# Patient Record
Sex: Female | Born: 1975 | Hispanic: No | Marital: Married | Smoking: Never smoker
Health system: Southern US, Community
[De-identification: ages and names within clinical notes are randomized; demographics above are authoritative.]

## PROBLEM LIST (undated history)

## (undated) DIAGNOSIS — C4490 Unspecified malignant neoplasm of skin, unspecified: Secondary | ICD-10-CM

## (undated) DIAGNOSIS — Z803 Family history of malignant neoplasm of breast: Secondary | ICD-10-CM

## (undated) DIAGNOSIS — I10 Essential (primary) hypertension: Secondary | ICD-10-CM

## (undated) DIAGNOSIS — B009 Herpesviral infection, unspecified: Secondary | ICD-10-CM

## (undated) DIAGNOSIS — E785 Hyperlipidemia, unspecified: Secondary | ICD-10-CM

## (undated) DIAGNOSIS — Z800 Family history of malignant neoplasm of digestive organs: Secondary | ICD-10-CM

## (undated) DIAGNOSIS — Z8049 Family history of malignant neoplasm of other genital organs: Secondary | ICD-10-CM

## (undated) DIAGNOSIS — H409 Unspecified glaucoma: Secondary | ICD-10-CM

## (undated) HISTORY — DX: Unspecified malignant neoplasm of skin, unspecified: C44.90

## (undated) HISTORY — DX: Herpesviral infection, unspecified: B00.9

## (undated) HISTORY — PX: CHOLECYSTECTOMY: SHX55

## (undated) HISTORY — DX: Family history of malignant neoplasm of breast: Z80.3

## (undated) HISTORY — DX: Family history of malignant neoplasm of digestive organs: Z80.0

## (undated) HISTORY — DX: Hyperlipidemia, unspecified: E78.5

## (undated) HISTORY — DX: Family history of malignant neoplasm of other genital organs: Z80.49

## (undated) HISTORY — DX: Unspecified glaucoma: H40.9

## (undated) HISTORY — PX: HEMORRHOID SURGERY: SHX153

## (undated) HISTORY — DX: Essential (primary) hypertension: I10

---

## 2015-10-30 HISTORY — PX: DG CHOLECYSTOGRAPHY GALL BLADDER (ARMC HX): HXRAD1480

## 2018-05-16 ENCOUNTER — Encounter: Payer: Self-pay | Admitting: Family Medicine

## 2018-05-16 ENCOUNTER — Encounter (INDEPENDENT_AMBULATORY_CARE_PROVIDER_SITE_OTHER): Payer: Self-pay

## 2018-05-16 ENCOUNTER — Ambulatory Visit: Payer: Self-pay | Admitting: Family Medicine

## 2018-05-16 VITALS — BP 102/62 | HR 79 | Temp 98.2°F | Ht 63.5 in | Wt 140.2 lb

## 2018-05-16 DIAGNOSIS — I10 Essential (primary) hypertension: Secondary | ICD-10-CM | POA: Diagnosis not present

## 2018-05-16 DIAGNOSIS — E785 Hyperlipidemia, unspecified: Secondary | ICD-10-CM | POA: Diagnosis not present

## 2018-05-16 DIAGNOSIS — N939 Abnormal uterine and vaginal bleeding, unspecified: Secondary | ICD-10-CM

## 2018-05-16 MED ORDER — HYDROCHLOROTHIAZIDE 25 MG PO TABS: 25.0000 mg | ORAL_TABLET | Freq: Every day | ORAL | 3 refills | Status: DC

## 2018-05-16 NOTE — Progress Notes (Signed)
Chief Complaint  Patient presents with  . New Patient (Initial Visit)       New Patient Visit SUBJECTIVE: HPI: Ashley Shepard is an 42 y.o.female who is being seen for establishing care.  The patient was previously seen at office in [place].  She is here with her husband who helps translate.  The patient mainly speaks Portuguese/Spanish.  Hypertension Patient presents for hypertension follow up. She does monitor home blood pressures. She is compliant with medications. Patient has these side effects of medication: none She is adhering to a healthy diet overall. Exercise: 3-4x/week running; rowing  Having early cycle. A little heavier.  She has been under quite a bit of stress.  She is new to the country and her English is not very good.  Her cycles have been normal prior to this.  She is eating and drinking normally otherwise.   Allergies  Allergen Reactions  . Dipyrone Itching and Swelling    Itch and swelling Blood pressure gets low Throat swelling  . Iodine     Itch and swelling     Past Medical History:  Diagnosis Date  . Hyperlipidemia   . Skin cancer    Past Surgical History:  Procedure Laterality Date  . CESAREAN SECTION  1997  . DG CHOLECYSTOGRAPHY GALL BLADDER ([place] HX)  2017   Family History  Problem Relation Age of Onset  . Diabetes Mother   . Hypertension Mother   . Heart attack Father   . Hypertension Father    Allergies  Allergen Reactions  . Dipyrone Itching and Swelling    Itch and swelling Blood pressure gets low Throat swelling  . Iodine     Itch and swelling     Current Outpatient Medications:  .  hydrochlorothiazide (HYDRODIURIL) 25 MG tablet, Take 1 tablet (25 mg total) by mouth daily., Disp: 30 tablet, Rfl: 3  ROS Cardiovascular: Denies chest pain  Respiratory: Denies dyspnea   OBJECTIVE: BP 102/62 (BP Location: Left Arm, Patient Position: Sitting, Cuff Size: Normal)   Pulse 79   Temp 98.2 F (36.8 C) (Oral)   Ht 5' 3.5"  (1.613 m)   Wt 140 lb 4 oz (63.6 kg)   SpO2 97%   BMI 24.45 kg/m   Constitutional: -  VS reviewed -  Well developed, well nourished, appears stated age -  No apparent distress  Psychiatric: -  Oriented to person, place, and time -  Memory intact -  Affect and mood normal -  Fluent conversation, good eye contact -  Judgment and insight age appropriate  Eye: -  Conjunctivae clear, no discharge -  Pupils symmetric, round, reactive to light  ENMT: -  MMM    Pharynx moist, no exudate, no erythema  Neck: -  No gross swelling, no palpable masses -  Thyroid midline, not enlarged, mobile, no palpable masses  Cardiovascular: -  RRR -  No bruits -  No LE edema  Respiratory: -  Normal respiratory effort, no accessory muscle use, no retraction -  Breath sounds equal, no wheezes, no ronchi, no crackles  Musculoskeletal: -  No clubbing, no cyanosis -  Gait normal  Skin: -  No significant lesion on inspection -  Warm and dry to palpation   ASSESSMENT/PLAN: Essential hypertension - Plan: hydrochlorothiazide (HYDRODIURIL) 25 MG tablet  Abnormal uterine bleeding (AUB)  Hyperlipidemia, unspecified hyperlipidemia type  Change Micardis to hydrochlorothiazide alone.  Counseled on diet and exercise.  She needs to watch her salt intake. Recommend getting a  home blood pressure monitor to check readings at home. For abnormal uterine bleeding, I recommended watchful waiting.  She has a known stressor that could be throwing off her cycles.  I also suggested Aleve if her flow is heavy.  Could do oral progesterone challenge if she continues to have issues.  She requested some information for the gynecology team which I provided in her after visit summary.   Patient should return in 6 weeks. The patient and her husband voiced understanding and agreement to the plan.   [place], DO 05/16/18  12:04 PM

## 2018-05-16 NOTE — Patient Instructions (Addendum)
Keep the diet clean and stay active.  Stop the Micardis.   Get a home blood pressure monitor and check at home. Write down readings and we will use them to make decisions moving forward.  Mind the salt, stay active, elevate legs.   Call Center for [place] at [facility] at [phone] for an appointment.  They are located at [address], [place], [place], [address] (right across the hall from our office).  Consider taking Aleve (naproxen) 220 mg, 2 tabs twice daily for heavy flow.   Let us know if you need anything.

## 2018-05-16 NOTE — Progress Notes (Signed)
Pre visit review using our clinic review tool, if applicable. No additional management support is needed unless otherwise documented below in the visit note. 

## 2018-05-26 ENCOUNTER — Ambulatory Visit: Payer: Self-pay | Admitting: *Deleted

## 2018-05-26 NOTE — Telephone Encounter (Signed)
Patient and husband phoned. Patient has high blood pressure today. 9am 147/101, p.82, 10am 147/111, p. 68, 11:45a 152/113 p. 78, 2:30 137/107 p. 77. She take hydrodiuril 25 MG daily as prescribed. She has left arm and back of neck discomfort, no numbess or weakness reported. She reports having  a headache over the weekend but not today. She has been seeing "bubbles" with the headaches with some nausea.  Denies CP/dizziness/SOB.Advised emergency care for high blood pressure with neurologic symptoms. Husband will drive her to UC or [facility] at this time.  Reason for Disposition . [0] Systolic BP  >= 258 OR Diastolic >= 527 AND [7] cardiac or neurologic symptoms (e.g., chest pain, difficulty breathing, unsteady gait, blurred vision)  Answer Assessment - Initial Assessment Questions 1. BLOOD PRESSURE: "What is the blood pressure?" "Did you take at least two measurements 5 minutes apart?"    9am 147/101 p 82 10a 147/111 p.68 1200 152/113 p.78 136/94 p.76  2. ONSET: "When did you take your blood pressure?"      3. HOW: "How did you obtain the blood pressure?" (e.g., visiting nurse, automatic home BP monitor)     Automatic home monitor 4. HISTORY: "Do you have a history of high blood pressure?"     yes 5. MEDICATIONS: "Are you taking any medications for blood pressure?" "Have you missed any doses recently?"     no 6. OTHER SYMPTOMS: "Do you have any symptoms?" (e.g., headache, chest pain, blurred vision, difficulty breathing, weakness)     Left arm discomfort, back of neck discomfort, stomach ache, feeling need to have BM. Blurred vision. No CP 7. PREGNANCY: "Is there any chance you are pregnant?" "When was your last menstrual period?"     no  Protocols used: HIGH BLOOD PRESSURE-A-AH

## 2018-07-23 ENCOUNTER — Telehealth: Payer: Self-pay | Admitting: Obstetrics and Gynecology

## 2018-07-23 ENCOUNTER — Encounter: Payer: Self-pay | Admitting: Obstetrics and Gynecology

## 2018-07-23 ENCOUNTER — Ambulatory Visit: Payer: BLUE CROSS/BLUE SHIELD | Admitting: Obstetrics and Gynecology

## 2018-07-23 ENCOUNTER — Other Ambulatory Visit: Payer: Self-pay

## 2018-07-23 VITALS — BP 110/62 | HR 64 | Resp 14 | Ht 64.0 in | Wt 140.0 lb

## 2018-07-23 DIAGNOSIS — N943 Premenstrual tension syndrome: Secondary | ICD-10-CM | POA: Diagnosis not present

## 2018-07-23 DIAGNOSIS — Z809 Family history of malignant neoplasm, unspecified: Secondary | ICD-10-CM

## 2018-07-23 DIAGNOSIS — R102 Pelvic and perineal pain: Secondary | ICD-10-CM | POA: Diagnosis not present

## 2018-07-23 DIAGNOSIS — N760 Acute vaginitis: Secondary | ICD-10-CM | POA: Diagnosis not present

## 2018-07-23 MED ORDER — SERTRALINE HCL 50 MG PO TABS
50.0000 mg | ORAL_TABLET | Freq: Every day | ORAL | 5 refills | Status: DC
Start: 2018-07-23 — End: 2018-09-23

## 2018-07-23 NOTE — Patient Instructions (Signed)
Consider Dr. Rolan Lipa for your primary care provider.

## 2018-07-23 NOTE — Progress Notes (Signed)
42 y.o. No obstetric history on file. Married [place]  female here for annual exam.  She states that the past couple of months her cycle has been late by 4-5 days She states that she found in [place] that she has endometriosis but did not have the time to treat that because she moved to [place].   Also having vaginal discharge and itching.   Endometriosis diagnosed in January, 2019 in [place].  Strong cramping and heavy menses that has keep her at home.  Having pain outside of menstrual cycle, swelling in her abdomen. Did an MRI 11/08/2017 and showed endometriosis.  Endometrium with 1 cm mass.  Myometrium homogeneous.  Ovaries - left ovary with small hemorrhagic area.  Areas of masses near the right uterosacral ligament and near anus.   Menses can occur up to every 45 days.  Has PMS syndrome - anxiety, irritable, and without patience which resolves when she has her menses start.  Not able to take pills due to HTN.   Second marriage and would like to consider childbearing.   Here in [place] for 3 months so far.  Husband working for [employer].   PCP:   None.   Patient's last menstrual period was 07/04/2018.     Period Cycle (Days): 28 Period Duration (Days): 7 Period Pattern: Regular Menstrual Flow: Heavy Menstrual Control: Maxi pad, Tampon Dysmenorrhea: (!) Severe Dysmenorrhea Symptoms: Cramping     Sexually active: Yes.    The current method of family planning is none.    Exercising: Yes.    fitness ballet  Smoker:  no  Health Maintenance: Pap:  04/2018 per patient  History of abnormal Pap:  no MMG:  11/11/2017 - BI-RADS2, heterogeneous density.  [place]. Colonoscopy:  none BMD:   11/19/17 - normal in [place].  TDaP:  07/2017 Gardasil:   no HIV: --- Hep C: --- Screening Labs:  None.   reports that she has never smoked. She has never used smokeless tobacco. She reports that she does not drink alcohol or use drugs.  Past Medical History:  Diagnosis Date  . Essential  hypertension   . Hyperlipidemia   . Skin cancer     Past Surgical History:  Procedure Laterality Date  . CESAREAN SECTION  1997  . CHOLECYSTECTOMY    . DG CHOLECYSTOGRAPHY GALL BLADDER ([place] HX)  2017  . HEMORRHOID SURGERY      Current Outpatient Medications  Medication Sig Dispense Refill  . hydrochlorothiazide (HYDRODIURIL) 25 MG tablet Take 1 tablet (25 mg total) by mouth daily. 30 tablet 3  . telmisartan (MICARDIS) 20 MG tablet Take 40 mg by mouth daily.  1   No current facility-administered medications for this visit.     Family History  Problem Relation Age of Onset  . Diabetes Mother   . Hypertension Mother   . High Cholesterol Mother   . Heart attack Father   . Hypertension Father   . High Cholesterol Father   . Kidney disease Father   . Stroke Father   . Diabetes Father   . Heart disease Father   . Diabetes Daughter   . Depression Daughter   . High blood pressure Sister   . Cancer Maternal Grandmother        uterus, ovary, pancreas  . Heart attack Maternal Grandfather   . Heart attack Paternal Grandfather   . High blood pressure Sister   . Cancer Maternal Aunt 68       breast   . Cancer Maternal Uncle  59       colon    Review of Systems  Genitourinary: Positive for menstrual problem.       Endometriosis     Exam:   BP 110/62   Pulse 64   Resp 14   Ht 5\' 4"  (1.626 m)   Wt 140 lb (63.5 kg)   LMP 07/04/2018   BMI 24.03 kg/m     General appearance: alert, cooperative and appears stated age Head: Normocephalic, without obvious abnormality, atraumatic Neck: no adenopathy, supple, symmetrical, trachea midline and thyroid normal to inspection and palpation Lungs: clear to auscultation bilaterally Breasts: normal appearance, no masses or tenderness, No nipple retraction or dimpling, No nipple discharge or bleeding, No axillary or supraclavicular adenopathy Heart: regular rate and rhythm Abdomen: soft, non-tender; no masses, no  organomegaly Extremities: extremities normal, atraumatic, no cyanosis or edema Skin: Skin color, texture, turgor normal. No rashes or lesions Lymph nodes: Cervical, supraclavicular, and axillary nodes normal. No abnormal inguinal nodes palpated Neurologic: Grossly normal  Pelvic: External genitalia:  no lesions              Urethra:  normal appearing urethra with no masses, tenderness or lesions              Bartholins and Skenes: normal                 Vagina: normal appearing vagina with normal color and discharge, no lesions              Cervix: no lesions              Pap taken: No. Bimanual Exam:  Uterus:  normal size, contour, position, consistency, mobility, non-tender              Adnexa: no mass, fullness, tenderness              Rectal exam: Yes.  .  Confirms.              Anus:  normal sphincter tone, no lesions  Chaperone was present for exam.  Assessment:     Pelvic pain.  Possible endometriosis.  Vaginitis. Interest in future fertility.  PMS. HTN. FH cancers.   Plan:   We will need a copy of the actual MRI images for re-review here.  Return visit for pelvic ultrasound here.  Referral for genetic counseling and testing.  Fertility planned performed with discussion of AMA status.  I think she really needs to see a reproductive endocrinologist.  She will discuss this with her husband. Discussed PMS.  Start Soloft 50 mg daily for 14 days prior to menses.  Discussed side effects and serotonin syndrome.  Affirm.  Follow up annually and prn.    __60_____ minutes face to face time of which over 50% was spent in counseling.   After visit summary provided.

## 2018-07-23 NOTE — Telephone Encounter (Signed)
Called placed by the interpreter service to convey benefits. Left message requesting a return call. Rep: Su Grand. ID: [id number]

## 2018-07-24 LAB — VAGINITIS/VAGINOSIS, DNA PROBE
Candida Species: NEGATIVE
GARDNERELLA VAGINALIS: NEGATIVE
Trichomonas vaginosis: NEGATIVE

## 2018-08-04 NOTE — Telephone Encounter (Signed)
Spoke with patient via interpreter, Ashley Shepard, [phone] regarding benefit for ultraosund. Patient understood and agreeable. Patient ready to schedule. Patient scheduled 08/07/18 with Dr Quincy Simmonds. Patient aware of appointment date, arrival time and e of cancellation policy.    Patient asked for lab results from last appointment. Call routed to Glorianne Manchester, RN

## 2018-08-04 NOTE — Telephone Encounter (Signed)
Spoke with patient via interpreter, Rosana, # [id number]. Advised patient of negative vaginitis testing dated 07/23/18 per Dr. Quincy Simmonds. Patient verbalizes understanding and is agreeable. Encounter closed.

## 2018-08-04 NOTE — Telephone Encounter (Signed)
Patient returned call. Returned her call with interpreter: Ashley Shepard, # [id number].

## 2018-08-04 NOTE — Telephone Encounter (Signed)
Call placed to patient (without an interpreter). Patient advises [place] and will need to call back.

## 2018-08-07 ENCOUNTER — Telehealth: Payer: Self-pay | Admitting: Genetics

## 2018-08-07 ENCOUNTER — Ambulatory Visit: Payer: BLUE CROSS/BLUE SHIELD | Admitting: Obstetrics and Gynecology

## 2018-08-07 ENCOUNTER — Other Ambulatory Visit: Payer: Self-pay | Admitting: Family Medicine

## 2018-08-07 ENCOUNTER — Ambulatory Visit (INDEPENDENT_AMBULATORY_CARE_PROVIDER_SITE_OTHER): Payer: BLUE CROSS/BLUE SHIELD

## 2018-08-07 ENCOUNTER — Encounter: Payer: Self-pay | Admitting: Genetics

## 2018-08-07 VITALS — BP 116/64 | HR 66 | Resp 14 | Ht 64.0 in | Wt 139.6 lb

## 2018-08-07 DIAGNOSIS — Z3141 Encounter for fertility testing: Secondary | ICD-10-CM | POA: Diagnosis not present

## 2018-08-07 DIAGNOSIS — R102 Pelvic and perineal pain: Secondary | ICD-10-CM

## 2018-08-07 DIAGNOSIS — N926 Irregular menstruation, unspecified: Secondary | ICD-10-CM

## 2018-08-07 DIAGNOSIS — I10 Essential (primary) hypertension: Secondary | ICD-10-CM

## 2018-08-07 NOTE — Telephone Encounter (Signed)
New referral received from Dr. Quincy Simmonds for genetic counseling. Pt cld and has been scheduled to see Ferol Luz on 10/22 at 10am. Pt aware to arrive 30 minutes early. Letter mailed.

## 2018-08-07 NOTE — Progress Notes (Signed)
GYNECOLOGY  VISIT   HPI: 42 y.o.   Married  [place]   female   No obstetric history on file. with Patient's last menstrual period was 07/04/2018.   here for   Ultrasound.  [place] interpretor present today.   Endometriosis diagnosed in [place] by MRI prior to moving to the [place].  She has not treated this to date.   Considering childbearing with her second husband.   Has PMDD and I recently prescribed Zoloft during premenstrual period.  GYNECOLOGIC HISTORY: Patient's last menstrual period was 07/04/2018. Contraception:  None  Menopausal hormone therapy:  none Last mammogram:  11/11/17  Bi rads 2 Heterogeneous density [place]  Last pap smear:   7/19 per patient.         OB History   None        Patient Active Problem List   Diagnosis Date Noted  . Essential hypertension 05/16/2018  . Abnormal uterine bleeding (AUB) 05/16/2018    Past Medical History:  Diagnosis Date  . Essential hypertension   . Hyperlipidemia   . Skin cancer     Past Surgical History:  Procedure Laterality Date  . CESAREAN SECTION  1997  . CHOLECYSTECTOMY    . DG CHOLECYSTOGRAPHY GALL BLADDER ([facility] HX)  2017  . HEMORRHOID SURGERY      Current Outpatient Medications  Medication Sig Dispense Refill  . hydrochlorothiazide (HYDRODIURIL) 25 MG tablet TAKE 1 TABLET BY MOUTH EVERY DAY 30 tablet 0  . sertraline (ZOLOFT) 50 MG tablet Take 1 tablet (50 mg total) by mouth daily. Take for 14 days in a row prior to menses. 28 tablet 5  . telmisartan (MICARDIS) 20 MG tablet Take 40 mg by mouth daily.  1   No current facility-administered medications for this visit.      ALLERGIES: Dipyrone and Iodine  Family History  Problem Relation Age of Onset  . Diabetes Mother   . Hypertension Mother   . High Cholesterol Mother   . Heart attack Father   . Hypertension Father   . High Cholesterol Father   . Kidney disease Father   . Stroke Father   . Diabetes Father   . Heart disease Father   . Diabetes  Daughter   . Depression Daughter   . High blood pressure Sister   . Cancer Maternal Grandmother        uterus, ovary, pancreas  . Heart attack Maternal Grandfather   . Heart attack Paternal Grandfather   . High blood pressure Sister   . Cancer Maternal Aunt 24       breast   . Cancer Maternal Uncle 13       colon    Social History   Socioeconomic History  . Marital status: Married    Spouse name: Not on file  . Number of children: Not on file  . Years of education: Not on file  . Highest education level: Not on file  Occupational History  . Not on file  Social Needs  . Financial resource strain: Not on file  . Food insecurity:    Worry: Not on file    Inability: Not on file  . Transportation needs:    Medical: Not on file    Non-medical: Not on file  Tobacco Use  . Smoking status: Never Smoker  . Smokeless tobacco: Never Used  Substance and Sexual Activity  . Alcohol use: Never    Frequency: Never  . Drug use: Never  . Sexual activity: Not on  file  Lifestyle  . Physical activity:    Days per week: Not on file    Minutes per session: Not on file  . Stress: Not on file  Relationships  . Social connections:    Talks on phone: Not on file    Gets together: Not on file    Attends religious service: Not on file    Active member of club or organization: Not on file    Attends meetings of clubs or organizations: Not on file    Relationship status: Not on file  . Intimate partner violence:    Fear of current or ex partner: Not on file    Emotionally abused: Not on file    Physically abused: Not on file    Forced sexual activity: Not on file  Other Topics Concern  . Not on file  Social History Narrative  . Not on file    Review of Systems  All other systems reviewed and are negative.   PHYSICAL EXAMINATION:    BP 116/64 (BP Location: Right Arm, Patient Position: Sitting, Cuff Size: Normal)   Pulse 66   Resp 14   Ht 5\' 4"  (1.626 m)   Wt 139 lb 9.6 oz  (63.3 kg)   LMP 07/04/2018   BMI 23.96 kg/m     General appearance: alert, cooperative and appears stated age   Pelvic US  Uterus no masses.  EMS 7.38 mm. Right ovary with follicle.  Left ovary with 28 mm hemorrhagic versus other cyst.  No free fluid.   ASSESSMENT  Pelvic pain.  Irregular menses. Desire for future fertility. AMA status.  PMDD. FH cancers.  PLAN  AMH level. Discussed fertility and endometriosis.  Declines tx for pelvic pain.  Refer to RE&I.  She will bring a copy of her MRI to her visit with RE&I. We discussed her AMA status.  Continue Zoloft 50 mg for 2 weeks per month. Refer to [employer].     An After Visit Summary was printed and given to the patient.  ___25___ minutes face to face time of which over 50% was spent in counseling.

## 2018-08-08 NOTE — Telephone Encounter (Signed)
Not sure she is your patient.

## 2018-08-08 NOTE — Telephone Encounter (Signed)
I did see her in July and wanted her to f/u with me. Schedule her, 30 d sent in meanwhile. ty.

## 2018-08-08 NOTE — Telephone Encounter (Signed)
Called the patient informed of PCP instructions She will call back to schedule appt,,,she did not know what her availability would be

## 2018-08-08 NOTE — Telephone Encounter (Signed)
Called the patient left message to call back 

## 2018-08-10 ENCOUNTER — Encounter: Payer: Self-pay | Admitting: Obstetrics and Gynecology

## 2018-08-11 LAB — ANTI MULLERIAN HORMONE: ANTI-MULLERIAN HORMONE (AMH): 0.157 ng/mL

## 2018-08-12 ENCOUNTER — Other Ambulatory Visit: Payer: Self-pay | Admitting: *Deleted

## 2018-08-12 DIAGNOSIS — R7989 Other specified abnormal findings of blood chemistry: Secondary | ICD-10-CM

## 2018-08-12 DIAGNOSIS — E349 Endocrine disorder, unspecified: Secondary | ICD-10-CM

## 2018-08-12 DIAGNOSIS — Z3141 Encounter for fertility testing: Secondary | ICD-10-CM

## 2018-08-19 ENCOUNTER — Inpatient Hospital Stay: Payer: BLUE CROSS/BLUE SHIELD

## 2018-08-19 ENCOUNTER — Inpatient Hospital Stay: Payer: BLUE CROSS/BLUE SHIELD | Attending: Genetic Counselor | Admitting: Genetics

## 2018-08-19 ENCOUNTER — Encounter: Payer: Self-pay | Admitting: Genetics

## 2018-08-19 DIAGNOSIS — Z8049 Family history of malignant neoplasm of other genital organs: Secondary | ICD-10-CM | POA: Diagnosis not present

## 2018-08-19 DIAGNOSIS — Z803 Family history of malignant neoplasm of breast: Secondary | ICD-10-CM

## 2018-08-19 DIAGNOSIS — Z800 Family history of malignant neoplasm of digestive organs: Secondary | ICD-10-CM | POA: Diagnosis not present

## 2018-08-19 NOTE — Progress Notes (Signed)
REFERRING PROVIDER: Nunzio Cobbs, MD [address], [place] [address]  PRIMARY PROVIDER:  Patient, No Pcp Per  PRIMARY REASON FOR VISIT:  1. Family history of breast cancer   2. Family history of colon cancer   3. Family history of uterine cancer   4. Family history of pancreatic cancer     HISTORY OF PRESENT ILLNESS:   Ashley Shepard, a 42 y.o. female, was seen for a [place] cancer genetics consultation at the request of Dr. Yisroel Ramming due to a family history of cancer.  Ashley Shepard presents to clinic today to discuss the possibility of a hereditary predisposition to cancer, genetic testing, and to further clarify her future cancer risks, as well as potential cancer risks for family members.   Ashley Shepard is a 42 y.o. female with no personal history of cancer.    She reports having a few skin lesions removed that were abnormal, but not cancer.    HORMONAL RISK FACTORS:  Menarche was at age 11.  First live birth at age 21.  OCP use for approximately 0 years.  Ovaries intact: yes.  Hysterectomy: no.  Menopausal status: premenopausal.  HRT use: 0 years. Colonoscopy: no; not examined. Mammogram within the last year: yes. Number of breast biopsies: 0.  Past Medical History:  Diagnosis Date  . Essential hypertension   . Family history of breast cancer   . Family history of colon cancer   . Family history of pancreatic cancer   . Family history of uterine cancer   . Hyperlipidemia   . Skin cancer     Past Surgical History:  Procedure Laterality Date  . CESAREAN SECTION  1997  . CHOLECYSTECTOMY    . DG CHOLECYSTOGRAPHY GALL BLADDER ([facility] HX)  2017  . HEMORRHOID SURGERY      Social History   Socioeconomic History  . Marital status: Married    Spouse name: Not on file  . Number of children: Not on file  . Years of education: Not on file  . Highest education level: Not on file  Occupational History  . Not on file  Social  Needs  . Financial resource strain: Not on file  . Food insecurity:    Worry: Not on file    Inability: Not on file  . Transportation needs:    Medical: Not on file    Non-medical: Not on file  Tobacco Use  . Smoking status: Never Smoker  . Smokeless tobacco: Never Used  Substance and Sexual Activity  . Alcohol use: Never    Frequency: Never  . Drug use: Never  . Sexual activity: Not on file  Lifestyle  . Physical activity:    Days per week: Not on file    Minutes per session: Not on file  . Stress: Not on file  Relationships  . Social connections:    Talks on phone: Not on file    Gets together: Not on file    Attends religious service: Not on file    Active member of club or organization: Not on file    Attends meetings of clubs or organizations: Not on file    Relationship status: Not on file  Other Topics Concern  . Not on file  Social History Narrative  . Not on file     FAMILY HISTORY:  We obtained a detailed, 4-generation family history.  Significant diagnoses are listed below: Family History  Problem Relation Age of Onset  .  Diabetes Mother   . Hypertension Mother   . High Cholesterol Mother   . Heart attack Father   . Hypertension Father   . High Cholesterol Father   . Kidney disease Father   . Stroke Father   . Diabetes Father   . Heart disease Father   . Diabetes Daughter   . Depression Daughter   . High blood pressure Sister   . Ovarian cancer Maternal Grandmother 20       ut and ov cancer dx at the same time in erh 60's,  . Uterine cancer Maternal Grandmother 60  . Pancreatic cancer Maternal Grandmother 80       died 1 month after  . Heart attack Maternal Grandfather   . Heart attack Paternal Grandfather   . High blood pressure Sister   . Breast cancer Maternal Aunt 68  . Colon cancer Maternal Uncle 23  . Pancreatic cancer Paternal Grandmother     Ashley Shepard has a 86 year-old daughter with no history of cancer.  Ashley Shepard has 3 sisters  ages 64. 25, and 80 with no history of cancer.  Her youngest sister had a hysterectomy due to ovarian cysts.   Ashley Shepard father: 21, no history of cancer.  Paternal Aunts/Uncles: 1 paternal uncle is 49 with no history of cancer.  Paternal cousins: no history of cancer.  Paternal grandfather: died of cancer, unk what type Paternal grandmother:pancreatic cancer.  Ashley Shepard mother:  88, no history of cancer, uterus and ovaries intact Maternal Aunts/Uncles: 2 maternal aunts (1 died of meningitis at 31 and the other has a hx of breast cancer dx at 79) and 1 maternal uncle who had colon cancer dx at 105. Maternal cousins: no history of cancer.  Maternal grandfather: died at 22 with no hx of cancer, heart disease Maternal grandmother:dx with uterine and ovarian cancer in her 61's, and at 26 she died of pancreatic cancer. She had a brother who had throat cancer.   Ashley Shepard is unaware of previous family history of genetic testing for hereditary cancer risks. Patient's maternal ancestors are of [place] descent, and paternal ancestors are of Portuguese/African/Chinese descent. There is no reported Ashkenazi Jewish ancestry. There is no known consanguinity.  GENETIC COUNSELING ASSESSMENT: Ashley Shepard is a 42 y.o. female with a family history which is somewhat suggestive of a Hereditary Cancer Predisposition Syndrome. We, therefore, discussed and recommended the following at today's visit.   DISCUSSION: We reviewed the characteristics, features and inheritance patterns of hereditary cancer syndromes. We also discussed genetic testing, including the appropriate family members to test, the process of testing, insurance coverage and turn-around-time for results. We discussed the implications of a negative, positive and/or variant of uncertain significant result. We recommended Ashley Shepard pursue genetic testing for the Common Hereditary Cancers gene panel + melanoma panel + basal cell nevus  panel:  The Common Hereditary Cancer Panel offered by [facility] includes sequencing and/or deletion duplication testing of the following 55 genes: APC, ATM, AXIN2, BARD1, BLM, BMPR1A, BRCA1, BRCA2, BRIP1, BUB1B, CDH1, CDK4, CDKN2A, CEP57, CHEK2, CTNNA1, DICER1, ENG, EPCAM, GALNT12, GREM1, HOXB13, KIT, MEN1, MLH1, MLH3, MSH2, MSH3, MSH6, MUTYH, NBN, NF1, NTHL1, PALB2, PDGFRA, PMS2, POLD1, POLE, PTEN, RAD50, RAD51C, RAD51D, RNF43, RPS20, SDHA, SDHB, SDHC, SDHD, SMAD4, SMARCA4, STK11, TP53, TSC1, TSC2, VHL  The Melanoma panel offered by [facility] includes sequencing and/or deletion duplication testing of the following 9 genes: BAP1, BRCA2, BRIP1, CDK4, CDKN2A (p14ARF), CDKN2A (p16INK4a), POT1, PTEN, RB1, and TP53.  The following  gene was evaluated for sequence changes only: MITF (c.952G>A, p.GLU318Lys variant only).  Basal Cell nevus syndrome panel: PTCH1, SUFU  We discussed that only 5-10% of cancers are associated with a Hereditary Cancer Predisposition Syndrome.  The most common hereditary cancer syndrome associated with colon cancer is Lynch Syndrome.  Lynch Syndrome is caused by mutations in the genes: MLH1, MSH2, MSH6, PMS2 and EPCAM.  This syndrome increases the risk for colon, uterine, ovarian and stomach cancers, as well as others.  Families with Lynch Syndrome tend to have multiple family members with these cancers, typically diagnosed under age 63, and diagnoses in multiple generations.    We discussed that there are several other genes that are associated with an increased risk for colon cancer and increased polyp burden (MUTYH, APC, POLE, CHEK2, etc.) We also dicussed that there are many genes that cause many different types of cancer risks.    We discussed that if she is found to have a mutation in one of these genes, it may impact future medical management recommendations such as increased cancer screenings and consideration of risk reducing surgeries.  A positive result could also have  implications for the patient's family members.  A Negative result would mean we did not find a hereditary predisposition to cancer in Ashley Shepard, but does not rule out the possibility of a hereditary risk for cancer.  There could be mutations that are undetectable by current technology, or in genes not yet tested or identified to increase cancer risk.    We discussed the potential to find a Variant of Uncertain Significance or VUS.  These are variants that have not yet been identified as pathogenic or benign, and it is unknown if this variant is associated with increased cancer risk or if this is a normal finding.  Most VUS's are reclassified to benign or likely benign.   It should not be used to make medical management decisions. With time, we suspect the lab will determine the significance of any VUS's identified if any.   Based on Ashley Shepard's family history of cancer, she meets medical criteria for genetic testing. Despite that she meets criteria, she may still have an out of pocket cost. The laboratory can provide an estimate of her OOP cost.  hospital was provided the contact information for the laboratory if hospital has further questions.   [facility] [place] Breast cancer lifetime risk: 12.3%.  (considered high risk if 20% or higher lifetime risk)     We discussed that some people do not want to undergo genetic testing due to fear of genetic discrimination.  A federal law called the Genetic Information Non-Discrimination Act (GINA) of 2008 helps protect individuals against genetic discrimination based on their genetic test results.  It impacts both health insurance and employment.  For health insurance, it protects against increased premiums, being kicked off insurance or being forced to take a test in order to be insured.  For employment it protects against hiring, firing and promoting decisions based on genetic test results.  Health status due to a cancer diagnosis is not protected under GINA.   This law does not protect life insurance, disability insurance, or other types of insurance.   PLAN: After considering the risks, benefits, and limitations, Ashley Shepard  provided informed consent to pursue genetic testing and the blood sample was sent to [facility] for analysis of the Common Hereditary Cancers Panel + melanoma panel + Basal Cell Nevus Panel. Results should be available within approximately 2-3 weeks' time,  at which point they will be disclosed by telephone to Ashley Shepard, as will any additional recommendations warranted by these results. Ashley Shepard will receive a summary of her genetic counseling visit and a copy of her results once available. This information will also be available in Epic. We encouraged Ashley Shepard to remain in contact with cancer genetics annually so that we can continuously update the family history and inform her of any changes in cancer genetics and testing that may be of benefit for her family. Ashley Shepard questions were answered to her satisfaction today. Our contact information was provided should additional questions or concerns arise.  Based on Ashley Shepard's family history, we recommended her relatives (especially those affected with cancer) also have genetic counseling and testing. Ms. Chaudhary will let us know if we can be of any assistance in coordinating genetic counseling and/or testing for this family member.   Lastly, we encouraged Ms. Winzer to remain in contact with cancer genetics annually so that we can continuously update the family history and inform her of any changes in cancer genetics and testing that may be of benefit for this family.  She reports they are all in [place], and there is not as much opportunity for this kind of testing there. We can try to help identify genetics professionals in [place] should they be interested in pursuing testing.   Ms.  Magri questions were answered to her satisfaction today. Our contact information  was provided should additional questions or concerns arise. Thank you for the referral and allowing [place] to share in the care of your patient.   Tana Felts, MS, [facility] Genetic Counselor [email or web address] phone: [phone]  The patient was seen for a total of 50 minutes in face-to-face genetic counseling. The patient was accompanied today by the [place] interpreter.

## 2018-09-11 ENCOUNTER — Telehealth: Payer: Self-pay | Admitting: Genetics

## 2018-09-11 ENCOUNTER — Ambulatory Visit: Payer: Self-pay | Admitting: Genetics

## 2018-09-11 ENCOUNTER — Encounter: Payer: Self-pay | Admitting: Genetics

## 2018-09-11 DIAGNOSIS — Z803 Family history of malignant neoplasm of breast: Secondary | ICD-10-CM

## 2018-09-11 DIAGNOSIS — Z1379 Encounter for other screening for genetic and chromosomal anomalies: Secondary | ICD-10-CM

## 2018-09-11 DIAGNOSIS — Z800 Family history of malignant neoplasm of digestive organs: Secondary | ICD-10-CM

## 2018-09-11 DIAGNOSIS — Z8049 Family history of malignant neoplasm of other genital organs: Secondary | ICD-10-CM

## 2018-09-11 NOTE — Telephone Encounter (Signed)
Used [facility] [employer] [phone] ([place]) to make phone call.   Revealed negative genetic testing.   This normal result is reassuring and indicates that it is unlikely Ashley Shepard has a hereditary predisposition to cancer. However, genetic testing is not perfect, and cannot definitively rule out a hereditary cause.  It will be important for her to keep in contact with genetics to learn if any additional testing may be needed in the future.     Still recommended maternal relatives (especially those affected with cancer) also have genetic testing as there could be a mutation in the family still that Ashley Shepard did not inherit and therefore was not found in her test.

## 2018-09-11 NOTE — Progress Notes (Signed)
HPI:  Ashley Shepard was previously seen in the [facility] clinic on 08/19/2018 due to a family history of cancer and concerns regarding a hereditary predisposition to cancer. Please refer to our prior cancer genetics clinic note for more information regarding Ashley Shepard's medical, social and family histories, and our assessment and recommendations, at the time. Ashley Shepard recent genetic test results were disclosed to her, as well as recommendations warranted by these results. These results and recommendations are discussed in more detail below.  CANCER HISTORY:   No history exists.     FAMILY HISTORY:  We obtained a detailed, 4-generation family history.  Significant diagnoses are listed below: Family History  Problem Relation Age of Onset  . Diabetes Mother   . Hypertension Mother   . High Cholesterol Mother   . Heart attack Father   . Hypertension Father   . High Cholesterol Father   . Kidney disease Father   . Stroke Father   . Diabetes Father   . Heart disease Father   . Diabetes Daughter   . Depression Daughter   . High blood pressure Sister   . Ovarian cancer Maternal Grandmother 28       ut and ov cancer dx at the same time in erh 60's,  . Uterine cancer Maternal Grandmother 60  . Pancreatic cancer Maternal Grandmother 80       died 1 month after  . Heart attack Maternal Grandfather   . Heart attack Paternal Grandfather   . High blood pressure Sister   . Breast cancer Maternal Aunt 68  . Colon cancer Maternal Uncle 10  . Pancreatic cancer Paternal Grandmother     Ashley Shepard has a 60 year-old daughter with no history of cancer.  Ashley Shepard has 3 sisters ages 53. 54, and 54 with no history of cancer.  Her youngest sister had a hysterectomy due to ovarian cysts.   Ashley Shepard father: 43, no history of cancer.  Paternal Aunts/Uncles: 1 paternal uncle is 31 with no history of cancer.  Paternal cousins: no history of cancer.  Paternal grandfather:  died of cancer, unk what type Paternal grandmother:pancreatic cancer.  Ashley Shepard mother:  19, no history of cancer, uterus and ovaries intact Maternal Aunts/Uncles: 2 maternal aunts (1 died of meningitis at 104 and the other has a hx of breast cancer dx at 61) and 1 maternal uncle who had colon cancer dx at 45. Maternal cousins: no history of cancer.  Maternal grandfather: died at 90 with no hx of cancer, heart disease Maternal grandmother:dx with uterine and ovarian cancer in her 17's, and at 67 she died of pancreatic cancer. She had a brother who had throat cancer.   Ashley Shepard is unaware of previous family history of genetic testing for hereditary cancer risks. Patient's maternal ancestors are of [place] descent, and paternal ancestors are of Portuguese/African/Chinese descent. There is no reported Ashkenazi Jewish ancestry. There is no known consanguinity.  GENETIC TEST RESULTS: Genetic testing performed through [facility] Common Hereditary Cancers Panel + Melanoma + Basal Cell Nevus Panel reported out on 09/05/2018 showed no pathogenic mutations.  61 genes: APC, ATM, AXIN2, BARD1, BAP1, BLM, BMPR1A, BRCA1, BRCA2, BRIP1, BUB1B, CDH1, CDK4, CDKN2A, CEP57, CHEK2, CTNNA1, DICER1, ENG, EPCAM, GALNT12, GREM1, HOXB13, KIT, MITF, MEN1, MLH1, MLH3, MSH2, MSH3, MSH6, MUTYH, NBN, NF1, NTHL1, POT1, PTCH1, PALB2, PDGFRA, PMS2, POLD1, POLE, PTEN, RB1, RAD50, RAD51C, RAD51D, RNF43, RPS20, SDHA, SDHB, SDHC, SDHD, SMAD4, SMARCA4, STK11, SUFU TP53, TSC1, TSC2, VHL.  The test report will be scanned into EPIC and will be located under the Molecular Pathology section of the Results Review tab. A portion of the result report is included below for reference.     We discussed with Ashley Shepard that because current genetic testing is not perfect, it is possible there may be a gene mutation in one of these genes that current testing cannot detect, but that chance is small.  We also discussed, that there could be  another gene that has not yet been discovered, or that we have not yet tested, that is responsible for the cancer diagnoses in the family. It is also possible there is a hereditary cause for the cancer in the family that Ashley Shepard did not inherit and therefore was not identified in her testing.  Therefore, it is important to remain in touch with cancer genetics in the future so that we can continue to offer Ashley Shepard the most up to date genetic testing.   ADDITIONAL GENETIC TESTING: We discussed with Ashley Shepard that her genetic testing was fairly extensive.  If there are are genes identified to increase cancer risk that can be analyzed in the future, we would be happy to discuss and coordinate this testing at that time.    CANCER SCREENING RECOMMENDATIONS: Ashley Shepard test result is negative (normal).  This means that we have not identified a hereditary predisposition to cancer in her at this time.   While reassuring, this does not definitively rule out a hereditary predisposition to cancer. It is still possible that there could be genetic mutations that are undetectable by current technology, or genetic mutations in genes that have not been tested or identified to increase cancer risk.  Therefore, it is recommended she continue to follow the cancer management and screening guidelines provided by her oncology and primary healthcare provider. An individual's cancer risk is not determined by genetic test results alone.  Overall cancer risk assessment includes additional factors such as personal medical history, family history, etc.  These should be used to make a personalized plan for cancer prevention and surveillance.    [place] [place] Breast cancer lifetime risk: 12.3%.  (considered high risk if 20% or higher lifetime risk)     RECOMMENDATIONS FOR FAMILY MEMBERS:  Relatives in this family might be at some increased risk of developing cancer, over the general population risk, simply due to the  family history of cancer.  We recommended women in this family have a yearly mammogram beginning at age 51, or 59 years younger than the earliest onset of cancer, an annual clinical breast exam, and perform monthly breast self-exams. Women in this family should also have a gynecological exam as recommended by their primary provider. All family members should have a colonoscopy  as directed by their doctors.  All family members should inform their physicians about the family history of cancer so their doctors can make the most appropriate screening recommendations for them.   It is also possible there is a hereditary cause for the cancer in Ashley Shepard's family that she did not inherit and therefore was not identified in her.   Therefore, we recommended maternal relatives (especially those affected with cancer) also have genetic counseling and testing. Ashley Shepard will let us know if we can be of any assistance in coordinating genetic counseling and/or testing for these family members.   FOLLOW-UP: Lastly, we discussed with Ashley Shepard that cancer genetics is a rapidly advancing field and it is possible that new  genetic tests will be appropriate for her and/or her family members in the future. We encouraged her to remain in contact with cancer genetics on an annual basis so we can update her personal and family histories and let her know of advances in cancer genetics that may benefit this family.   Our contact number was provided. Ashley Shepard questions were answered to her satisfaction, and she knows she is welcome to call us at anytime with additional questions or concerns.   Ferol Luz, MS, [facility] Certified Genetic Counselor [email or web address]

## 2018-09-15 ENCOUNTER — Other Ambulatory Visit: Payer: Self-pay | Admitting: Family Medicine

## 2018-09-15 DIAGNOSIS — I10 Essential (primary) hypertension: Secondary | ICD-10-CM

## 2018-09-15 MED ORDER — HYDROCHLOROTHIAZIDE 25 MG PO TABS: 25.0000 mg | ORAL_TABLET | Freq: Every day | ORAL | 0 refills | Status: DC

## 2018-09-23 ENCOUNTER — Other Ambulatory Visit: Payer: Self-pay | Admitting: Obstetrics and Gynecology

## 2018-09-23 NOTE — Telephone Encounter (Signed)
Medication refill request: zoloft 50mg  Last AEX:  07-23-18 Next AEX: not yet scheduled Last MMG (if hormonal medication request): 11-11-17 birads 2 done in [place]  Refill authorized: last given 9/19 for patient to take for 2weeks each month #28 with 5 refills. Pharmacy is requesting 74mth supply. Please approve if appropriate

## 2018-09-26 ENCOUNTER — Encounter: Payer: Self-pay | Admitting: Genetics

## 2018-10-07 ENCOUNTER — Other Ambulatory Visit: Payer: Self-pay | Admitting: Family Medicine

## 2018-10-07 DIAGNOSIS — I10 Essential (primary) hypertension: Secondary | ICD-10-CM

## 2018-10-26 ENCOUNTER — Other Ambulatory Visit: Payer: Self-pay | Admitting: Family Medicine

## 2018-10-26 DIAGNOSIS — I10 Essential (primary) hypertension: Secondary | ICD-10-CM

## 2018-10-29 DIAGNOSIS — R7401 Elevation of levels of liver transaminase levels: Secondary | ICD-10-CM

## 2018-10-29 HISTORY — DX: Elevation of levels of liver transaminase levels: R74.01

## 2018-11-05 ENCOUNTER — Other Ambulatory Visit: Payer: Self-pay | Admitting: Family Medicine

## 2018-11-05 DIAGNOSIS — I10 Essential (primary) hypertension: Secondary | ICD-10-CM

## 2018-11-05 MED ORDER — HYDROCHLOROTHIAZIDE 25 MG PO TABS: 25.0000 mg | ORAL_TABLET | Freq: Every day | ORAL | 1 refills | Status: DC

## 2018-11-05 NOTE — Addendum Note (Signed)
Addended by: Sharon Seller B on: 11/05/2018 09:35 AM   Modules accepted: Orders

## 2019-05-15 ENCOUNTER — Telehealth: Payer: Self-pay | Admitting: Obstetrics and Gynecology

## 2019-05-15 NOTE — Telephone Encounter (Signed)
Patient sent the following message through [place]. Routing to triage to assist patient with request.  Appointment Request From: Ashley Shepard  With Provider: Arloa Koh, MD [facility]]  Preferred Date Range: 05/19/2019 - 05/22/2019  Preferred Times: Monday Morning, Tuesday Morning, Wednesday Afternoon, Thursday Afternoon, Friday Afternoon  Reason for visit: Request an Appointment  Comments: my menstrual cycle is long overdue

## 2019-05-18 NOTE — Telephone Encounter (Signed)
Left message through interpreter [phone] to call Estill Bamberg, CMA. (left message on Hm# and Cell#).

## 2019-05-19 ENCOUNTER — Telehealth: Payer: Self-pay | Admitting: Obstetrics and Gynecology

## 2019-05-19 NOTE — Telephone Encounter (Signed)
Appointment Request From: Graceann Congress    With Provider: Arloa Koh, MD [facility]]    Preferred Date Range: 05/20/2019 - 05/22/2019    Preferred Times: Any Time    Reason for visit: Request an Appointment    Comments:  Menopause

## 2019-05-19 NOTE — Telephone Encounter (Signed)
Left message to call Omarian Jaquith, RN at [facility] [phone].   

## 2019-05-22 NOTE — Telephone Encounter (Signed)
Patient returned call

## 2019-05-22 NOTE — Telephone Encounter (Signed)
Left message to call Emmanuela Ghazi, RN at [facility] [phone].   

## 2019-05-27 ENCOUNTER — Other Ambulatory Visit: Payer: Self-pay | Admitting: Family Medicine

## 2019-05-28 NOTE — Telephone Encounter (Signed)
Patient's husband is calling to schedule appointment for wife. Patient speaks [place]uguese and will need an interpreter if husband is not available at time of return call.

## 2019-05-28 NOTE — Telephone Encounter (Signed)
Called and spoke with patient's husband Ashley Shepard, okay per DPR. Patient complaining of irregular cycles, mood swings and hot flashes. She's had 2 negative pregnancy tests and request office visit. Made appointment with Dr.Silva for 06-08-19 10:30am.

## 2019-06-04 NOTE — Progress Notes (Signed)
GYNECOLOGY  VISIT   HPI: 43 y.o.   Married  [place]  female   No obstetric history on file. with No LMP recorded.   here for irregular menses.  Patient states she has not been bleeding for 72 days.  Today a small amount of spotting.  Hot flashes.  LE edema.  She did a urine pregnancy test which was negative x 2.  Tried an herbal treatment, Amberen for her symptoms of menopause.  Hx endometriosis dx by MRI per patient in [place].  She has a hx of irregular menses and pelvic pain.  Pelvic US 08/07/18 in office here showed normal uterus, symmetrical endometrium without masses, left ovary with 28 mm hemorrhagic CL cyst and normal right ovary.   She cannot take pills due to HTN.   Her AMH is 0.157 on 08/07/18. She saw RE and I.   She had negative genetic testing with respect to her FH cancer.   Does a lot of exercise and had a lot of general body pain.   Notes weight gain since moving to the [place].   UPT: Negative  GYNECOLOGIC HISTORY: No LMP recorded. Contraception: None Menopausal hormone therapy:  none Last mammogram: 11/11/17  Bi rads 2 Heterogeneous density [place]  Last pap smear:  7/19 per patient        OB History   No obstetric history on file.        Patient Active Problem List   Diagnosis Date Noted  . Genetic testing 09/11/2018  . Family history of breast cancer   . Family history of colon cancer   . Family history of uterine cancer   . Family history of pancreatic cancer   . Essential hypertension 05/16/2018  . Abnormal uterine bleeding (AUB) 05/16/2018    Past Medical History:  Diagnosis Date  . Essential hypertension   . Family history of breast cancer   . Family history of colon cancer   . Family history of pancreatic cancer   . Family history of uterine cancer   . Hyperlipidemia   . Skin cancer     Past Surgical History:  Procedure Laterality Date  . CESAREAN SECTION  1997  . CHOLECYSTECTOMY    . DG CHOLECYSTOGRAPHY GALL BLADDER ([facility])   2017  . HEMORRHOID SURGERY      Current Outpatient Medications  Medication Sig Dispense Refill  . hydrochlorothiazide (HYDRODIURIL) 25 MG tablet Take 1 tablet (25 mg total) by mouth daily. Needs follow up ov 90 tablet 0  . telmisartan (MICARDIS) 20 MG tablet Take 40 mg by mouth daily.  1   No current facility-administered medications for this visit.      ALLERGIES: Dipyrone and Iodine  Family History  Problem Relation Age of Onset  . Diabetes Mother   . Hypertension Mother   . High Cholesterol Mother   . Heart attack Father   . Hypertension Father   . High Cholesterol Father   . Kidney disease Father   . Stroke Father   . Diabetes Father   . Heart disease Father   . Diabetes Daughter   . Depression Daughter   . High blood pressure Sister   . Ovarian cancer Maternal Grandmother 75       ut and ov cancer dx at the same time in erh 60's,  . Uterine cancer Maternal Grandmother 60  . Pancreatic cancer Maternal Grandmother 80       died 1 month after  . Heart attack Maternal Grandfather   .  Heart attack Paternal Grandfather   . High blood pressure Sister   . Breast cancer Maternal Aunt 68  . Colon cancer Maternal Uncle 71  . Pancreatic cancer Paternal Grandmother     Social History   Socioeconomic History  . Marital status: Married    Spouse name: Not on file  . Number of children: Not on file  . Years of education: Not on file  . Highest education level: Not on file  Occupational History  . Not on file  Social Needs  . Financial resource strain: Not on file  . Food insecurity    Worry: Not on file    Inability: Not on file  . Transportation needs    Medical: Not on file    Non-medical: Not on file  Tobacco Use  . Smoking status: Never Smoker  . Smokeless tobacco: Never Used  Substance and Sexual Activity  . Alcohol use: Never    Frequency: Never  . Drug use: Never  . Sexual activity: Not on file  Lifestyle  . Physical activity    Days per week: Not on  file    Minutes per session: Not on file  . Stress: Not on file  Relationships  . Social Herbalist on phone: Not on file    Gets together: Not on file    Attends religious service: Not on file    Active member of club or organization: Not on file    Attends meetings of clubs or organizations: Not on file    Relationship status: Not on file  . Intimate partner violence    Fear of current or ex partner: Not on file    Emotionally abused: Not on file    Physically abused: Not on file    Forced sexual activity: Not on file  Other Topics Concern  . Not on file  Social History Narrative  . Not on file    Review of Systems  All other systems reviewed and are negative.   PHYSICAL EXAMINATION:    BP (!) 150/94   Pulse 70   Temp 98 F (36.7 C) (Temporal)   Ht 5\' 4"  (1.626 m)   Wt 143 lb (64.9 kg)   BMI 24.55 kg/m     General appearance: alert, cooperative and appears stated age   Pelvic: External genitalia:  no lesions              Urethra:  normal appearing urethra with no masses, tenderness or lesions              Bartholins and Skenes: normal                 Vagina: normal appearing vagina with normal color and discharge, no lesions              Cervix: no lesions.  menstraua flow noted.                 Bimanual Exam:  Uterus:  normal size, contour, position, consistency, mobility, non-tender              Adnexa: no mass, fullness, tenderness       Chaperone was present for exam.  ASSESSMENT   Irregular menses.  Perimenopausal female. Contraceptive needs. HTN. Difficulty controlling.   PLAN  We discussed tx options for hot flashes - Gabapentin, SSRI/SNRI.  She declines. Brochure on menopause to patient. Micronor 3 packs. She will schedule a mammogram.  FU in 5 weeks  for an annual exam.  She will establish care with Dr. Ruben Gottron.    An After Visit Summary was printed and given to the patient.  _25_____ minutes face to face time of which over 50%  was spent in counseling.

## 2019-06-08 ENCOUNTER — Other Ambulatory Visit: Payer: Self-pay

## 2019-06-08 ENCOUNTER — Encounter: Payer: Self-pay | Admitting: Obstetrics and Gynecology

## 2019-06-08 ENCOUNTER — Ambulatory Visit (INDEPENDENT_AMBULATORY_CARE_PROVIDER_SITE_OTHER): Payer: BC Managed Care – PPO | Admitting: Obstetrics and Gynecology

## 2019-06-08 VITALS — BP 150/94 | HR 70 | Temp 98.0°F | Ht 64.0 in | Wt 143.0 lb

## 2019-06-08 DIAGNOSIS — N926 Irregular menstruation, unspecified: Secondary | ICD-10-CM

## 2019-06-08 DIAGNOSIS — Z3009 Encounter for other general counseling and advice on contraception: Secondary | ICD-10-CM

## 2019-06-08 DIAGNOSIS — N951 Menopausal and female climacteric states: Secondary | ICD-10-CM

## 2019-06-08 LAB — POCT URINE PREGNANCY: Preg Test, Ur: NEGATIVE

## 2019-06-08 MED ORDER — NORETHINDRONE 0.35 MG PO TABS: 1.0000 | ORAL_TABLET | Freq: Every day | ORAL | 0 refills | Status: DC

## 2019-06-08 NOTE — Patient Instructions (Signed)
Consider Dr. Rolan Lipa for your new primary care provider.

## 2019-07-14 ENCOUNTER — Telehealth: Payer: Self-pay | Admitting: Obstetrics and Gynecology

## 2019-07-14 ENCOUNTER — Encounter: Payer: Self-pay | Admitting: Obstetrics and Gynecology

## 2019-07-14 NOTE — Telephone Encounter (Signed)
Routing to Dr. Quincy Simmonds to advise on Gardasil vaccine.   Cc: Lerry Liner

## 2019-07-14 NOTE — Telephone Encounter (Signed)
Patient sent the following correspondence through MyChart.  hello dr i would like to get the hpv vaccine is it possible? I will be 24 in October / 04! can you tell if you are covered by the agreement?thank you

## 2019-07-15 NOTE — Telephone Encounter (Signed)
Call to patient. Advised patient of message of seen below from Dr. Quincy Simmonds. Patient states she would like to discuss with her husband and have him return call tomorrow to discuss with the nurse.

## 2019-07-15 NOTE — Telephone Encounter (Signed)
Patient will be able to have one Gardasil vaccine out of three covered by her insurance.  Insurance will not pay after someone is 32 or older. The second and third vaccines would be paid out of pocket if she completes the series.

## 2019-07-28 NOTE — Telephone Encounter (Signed)
Patient previously advised of recommendations, no return call to date. Patient is scheduled for AEX 08/18/19.  Routing to Dr. Antony Blackbird.  Encounter closed.

## 2019-08-13 NOTE — Progress Notes (Signed)
43 y.o. No obstetric history on file. Married [place] female here for annual exam.    No menses in May, June or July. No menses since 07/04/19.  Taking Amberen for menopausal symptoms.  Had a lot of headache with Micronor and so she stopped it.  HA stopped after discontinuing this.  She does like having her menstrual period.   She has had E and I consultation and was told her chance of fertility is very low.   Patient complaining of white vaginal discharge--she was on Macrobid for UTI in September. She denies odor or itching.  She did not treat with anything over the counter.   Notes weight gain.   She is asking for blood work.   UPT:Neg  PCP:  None   Patient's last menstrual period was 07/04/2019 (exact date).     Period Pattern: (!) Irregular     Sexually active: Yes.    The current method of family planning is none.    Exercising: Yes.    walking and works out at gym. Smoker:  no  Health Maintenance: [id number] Neg per patient History of  abnormal Pap:  no MMG: 11-11-17 in [place]--see Epic Colonoscopy:  no BMD: 11-19-17  Result :normal in [place] TDaP:  07/2017 Gardasil:   no HIV:Neg in [place] Hep C:Neg in [place] Screening Labs:  Today.    reports that she has never smoked. She has never used smokeless tobacco. She reports that she does not drink alcohol or use drugs.  Past Medical History:  Diagnosis Date  . Essential hypertension   . Family history of breast cancer   . Family history of colon cancer   . Family history of pancreatic cancer   . Family history of uterine cancer   . Hyperlipidemia   . Skin cancer     Past Surgical History:  Procedure Laterality Date  . CESAREAN SECTION  1997  . CHOLECYSTECTOMY    . DG CHOLECYSTOGRAPHY GALL BLADDER ([facility]X)  2017  . HEMORRHOID SURGERY      Current Outpatient Medications  Medication Sig Dispense Refill  . hydrochlorothiazide (HYDRODIURIL) 25 MG tablet Take 1 tablet (25 mg total) by mouth daily. Needs  follow up ov 90 tablet 0  . OVER THE COUNTER MEDICATION Takes Amberen daily    . telmisartan (MICARDIS) 20 MG tablet Take 40 mg by mouth daily.  1   No current facility-administered medications for this visit.     Family History  Problem Relation Age of Onset  . Diabetes Mother   . Hypertension Mother   . High Cholesterol Mother   . Heart attack Father   . Hypertension Father   . High Cholesterol Father   . Kidney disease Father   . Stroke Father   . Diabetes Father   . Heart disease Father   . Diabetes Daughter   . Depression Daughter   . High blood pressure Sister   . Ovarian cancer Maternal Grandmother 29       ut and ov cancer dx at the same time in erh 60's,  . Uterine cancer Maternal Grandmother 60  . Pancreatic cancer Maternal Grandmother 80       died 1 month after  . Heart attack Maternal Grandfather   . Heart attack Paternal Grandfather   . High blood pressure Sister   . Breast cancer Maternal Aunt 68  . Colon cancer Maternal Uncle 19  . Pancreatic cancer Paternal Grandmother     Review of Systems  All other  systems reviewed and are negative.   Exam:   BP 120/82   Temp 98 F (36.7 C) (Temporal)   Ht 5\' 3"  (1.6 m)   Wt 141 lb 12.8 oz (64.3 kg)   LMP 07/04/2019 (Exact Date)   BMI 25.12 kg/m     General appearance: alert, cooperative and appears stated age Head: normocephalic, without obvious abnormality, atraumatic Neck: no adenopathy, supple, symmetrical, trachea midline and thyroid normal to inspection and palpation Lungs: clear to auscultation bilaterally Breasts: normal appearance, no masses or tenderness, No nipple retraction or dimpling, No nipple discharge or bleeding, No axillary adenopathy Heart: regular rate and rhythm Abdomen: soft, non-tender; no masses, no organomegaly Extremities: extremities normal, atraumatic, no cyanosis or edema Skin: skin color, texture, turgor normal. No rashes or lesions Lymph nodes: cervical, supraclavicular, and  axillary nodes normal. Neurologic: grossly normal  Pelvic: External genitalia:  no lesions              No abnormal inguinal nodes palpated.              Urethra:  normal appearing urethra with no masses, tenderness or lesions              Bartholins and Skenes: normal                 Vagina: normal appearing vagina with normal color and white creamy discharge, no lesions              Cervix: no lesions              Pap taken: Yes.   Bimanual Exam:  Uterus:  normal size, contour, position, consistency, mobility, non-tender              Adnexa: no mass, fullness, tenderness              Rectal exam: Yes.  .  Confirms.              Anus:  normal sphincter tone, no lesions  Chaperone was present for exam.  Assessment:   Well woman visit with normal exam. Vaginal discharge.  HTN.  Weight gain.  FH of uterine, colon, and breast CA.  Normal genetic testing for patient.  Egg allergy.  Oligomenorrhea.   Plan: Mammogram screening discussed.  She will schedule. Self breast awareness reviewed. We discussed contraceptive options.  She will use condoms if needed. Pap and HR HPV as above. Guidelines for Calcium, Vitamin D, regular exercise program including cardiovascular and weight bearing exercise. I discussed flu vaccine.  She will consider for her pharmacy.  Affirm. Routine labs, TSH, prolactin.  UPT now.  She will establish care with PCP in December.  Follow up annually and prn.    After visit summary provided.

## 2019-08-18 ENCOUNTER — Ambulatory Visit: Payer: BC Managed Care – PPO | Admitting: Obstetrics and Gynecology

## 2019-08-18 ENCOUNTER — Other Ambulatory Visit (HOSPITAL_COMMUNITY)
Admission: RE | Admit: 2019-08-18 | Discharge: 2019-08-18 | Disposition: A | Discharge status: Home / Self Care | Payer: BC Managed Care – PPO | Source: Ambulatory Visit | Attending: Obstetrics and Gynecology | Admitting: Obstetrics and Gynecology

## 2019-08-18 ENCOUNTER — Other Ambulatory Visit: Payer: Self-pay

## 2019-08-18 ENCOUNTER — Encounter: Payer: Self-pay | Admitting: Obstetrics and Gynecology

## 2019-08-18 VITALS — BP 120/82 | Temp 98.0°F | Ht 63.0 in | Wt 141.8 lb

## 2019-08-18 DIAGNOSIS — Z01419 Encounter for gynecological examination (general) (routine) without abnormal findings: Secondary | ICD-10-CM | POA: Diagnosis present

## 2019-08-18 DIAGNOSIS — N915 Oligomenorrhea, unspecified: Secondary | ICD-10-CM

## 2019-08-18 DIAGNOSIS — N898 Other specified noninflammatory disorders of vagina: Secondary | ICD-10-CM | POA: Diagnosis not present

## 2019-08-18 LAB — POCT URINE PREGNANCY: Preg Test, Ur: NEGATIVE

## 2019-08-18 NOTE — Patient Instructions (Signed)

## 2019-08-19 LAB — LIPID PANEL
Chol/HDL Ratio: 3.1 ratio (ref 0.0–4.4)
Cholesterol, Total: 250 mg/dL — ABNORMAL HIGH (ref 100–199)
HDL: 81 mg/dL (ref >39)
LDL Chol Calc (NIH): 143 mg/dL — ABNORMAL HIGH (ref 0–99)
Triglycerides: 148 mg/dL (ref 0–149)
VLDL Cholesterol Cal: 26 mg/dL (ref 5–40)

## 2019-08-19 LAB — COMPREHENSIVE METABOLIC PANEL
ALT: 48 IU/L — ABNORMAL HIGH (ref 0–32)
AST: 20 IU/L (ref 0–40)
Albumin/Globulin Ratio: 2 (ref 1.2–2.2)
Albumin: 4.8 g/dL (ref 3.8–4.8)
Alkaline Phosphatase: 52 IU/L (ref 39–117)
BUN/Creatinine Ratio: 19 (ref 9–23)
BUN: 13 mg/dL (ref 6–24)
Bilirubin Total: 0.2 mg/dL (ref 0.0–1.2)
CO2: 27 mmol/L (ref 20–29)
Calcium: 10 mg/dL (ref 8.7–10.2)
Chloride: 99 mmol/L (ref 96–106)
Creatinine, Ser: 0.69 mg/dL (ref 0.57–1.00)
GFR calc Af Amer: 122 mL/min/{1.73_m2} (ref >59)
GFR calc non Af Amer: 105 mL/min/{1.73_m2} (ref >59)
Globulin, Total: 2.4 g/dL (ref 1.5–4.5)
Glucose: 82 mg/dL (ref 65–99)
Potassium: 4.1 mmol/L (ref 3.5–5.2)
Sodium: 138 mmol/L (ref 134–144)
Total Protein: 7.2 g/dL (ref 6.0–8.5)

## 2019-08-19 LAB — PROLACTIN: Prolactin: 10.6 ng/mL (ref 4.8–23.3)

## 2019-08-19 LAB — CBC
Hematocrit: 42.1 % (ref 34.0–46.6)
Hemoglobin: 14.1 g/dL (ref 11.1–15.9)
MCH: 31.2 pg (ref 26.6–33.0)
MCHC: 33.5 g/dL (ref 31.5–35.7)
MCV: 93 fL (ref 79–97)
Platelets: 322 10*3/uL (ref 150–450)
RBC: 4.52 x10E6/uL (ref 3.77–5.28)
RDW: 12.8 % (ref 11.7–15.4)
WBC: 8.4 10*3/uL (ref 3.4–10.8)

## 2019-08-19 LAB — VAGINITIS/VAGINOSIS, DNA PROBE
Candida Species: NEGATIVE
Gardnerella vaginalis: NEGATIVE
Trichomonas vaginosis: NEGATIVE

## 2019-08-19 LAB — TSH: TSH: 1.39 u[IU]/mL (ref 0.450–4.500)

## 2019-08-20 LAB — CYTOLOGY - PAP
Comment: NEGATIVE
Diagnosis: NEGATIVE
High risk HPV: NEGATIVE

## 2019-08-21 ENCOUNTER — Encounter: Payer: Self-pay | Admitting: Obstetrics and Gynecology

## 2019-08-25 ENCOUNTER — Other Ambulatory Visit: Payer: Self-pay | Admitting: Obstetrics and Gynecology

## 2019-08-25 ENCOUNTER — Other Ambulatory Visit: Payer: Self-pay | Admitting: Family Medicine

## 2019-08-25 DIAGNOSIS — Z1231 Encounter for screening mammogram for malignant neoplasm of breast: Secondary | ICD-10-CM

## 2019-10-14 ENCOUNTER — Ambulatory Visit
Admission: RE | Admit: 2019-10-14 | Discharge: 2019-10-14 | Disposition: A | Discharge status: Home / Self Care | Payer: BC Managed Care – PPO | Source: Ambulatory Visit | Attending: Obstetrics and Gynecology | Admitting: Obstetrics and Gynecology

## 2019-10-14 ENCOUNTER — Other Ambulatory Visit: Payer: Self-pay

## 2019-10-14 DIAGNOSIS — Z1231 Encounter for screening mammogram for malignant neoplasm of breast: Secondary | ICD-10-CM

## 2019-10-16 ENCOUNTER — Other Ambulatory Visit: Payer: Self-pay | Admitting: Obstetrics and Gynecology

## 2019-10-16 DIAGNOSIS — R928 Other abnormal and inconclusive findings on diagnostic imaging of breast: Secondary | ICD-10-CM

## 2019-10-22 DIAGNOSIS — M791 Myalgia, unspecified site: Secondary | ICD-10-CM | POA: Insufficient documentation

## 2019-10-22 DIAGNOSIS — K760 Fatty (change of) liver, not elsewhere classified: Secondary | ICD-10-CM | POA: Insufficient documentation

## 2019-10-22 DIAGNOSIS — N951 Menopausal and female climacteric states: Secondary | ICD-10-CM | POA: Insufficient documentation

## 2019-10-23 ENCOUNTER — Other Ambulatory Visit: Payer: Self-pay | Admitting: Family Medicine

## 2019-10-26 NOTE — Telephone Encounter (Signed)
I don't think this pt sees me anymore. Ty.

## 2019-11-02 ENCOUNTER — Ambulatory Visit
Admission: RE | Admit: 2019-11-02 | Discharge: 2019-11-02 | Disposition: A | Discharge status: Home / Self Care | Payer: BC Managed Care – PPO | Source: Ambulatory Visit | Attending: Obstetrics and Gynecology | Admitting: Obstetrics and Gynecology

## 2019-11-02 ENCOUNTER — Other Ambulatory Visit: Payer: Self-pay | Admitting: Obstetrics and Gynecology

## 2019-11-02 ENCOUNTER — Other Ambulatory Visit: Payer: Self-pay

## 2019-11-02 DIAGNOSIS — N6489 Other specified disorders of breast: Secondary | ICD-10-CM

## 2019-11-02 DIAGNOSIS — R928 Other abnormal and inconclusive findings on diagnostic imaging of breast: Secondary | ICD-10-CM

## 2019-12-30 DIAGNOSIS — K219 Gastro-esophageal reflux disease without esophagitis: Secondary | ICD-10-CM | POA: Insufficient documentation

## 2020-05-19 ENCOUNTER — Ambulatory Visit
Admission: RE | Admit: 2020-05-19 | Discharge: 2020-05-19 | Disposition: A | Discharge status: Home / Self Care | Payer: BC Managed Care – PPO | Source: Ambulatory Visit | Attending: Obstetrics and Gynecology | Admitting: Obstetrics and Gynecology

## 2020-05-19 ENCOUNTER — Other Ambulatory Visit: Payer: Self-pay

## 2020-05-19 DIAGNOSIS — N6489 Other specified disorders of breast: Secondary | ICD-10-CM

## 2020-07-03 ENCOUNTER — Other Ambulatory Visit: Payer: Self-pay

## 2020-07-03 ENCOUNTER — Encounter (HOSPITAL_COMMUNITY): Payer: Self-pay | Admitting: Emergency Medicine

## 2020-07-03 ENCOUNTER — Emergency Department (HOSPITAL_COMMUNITY): Payer: BC Managed Care – PPO

## 2020-07-03 ENCOUNTER — Emergency Department (HOSPITAL_COMMUNITY)
Admission: EM | Admit: 2020-07-03 | Discharge: 2020-07-04 | Disposition: A | Discharge status: Home / Self Care | Payer: BC Managed Care – PPO | Attending: Emergency Medicine | Admitting: Emergency Medicine

## 2020-07-03 DIAGNOSIS — Z5321 Procedure and treatment not carried out due to patient leaving prior to being seen by health care provider: Secondary | ICD-10-CM | POA: Diagnosis not present

## 2020-07-03 DIAGNOSIS — R079 Chest pain, unspecified: Secondary | ICD-10-CM | POA: Insufficient documentation

## 2020-07-03 DIAGNOSIS — R1013 Epigastric pain: Secondary | ICD-10-CM | POA: Diagnosis not present

## 2020-07-03 DIAGNOSIS — M79601 Pain in right arm: Secondary | ICD-10-CM | POA: Diagnosis not present

## 2020-07-03 LAB — CBC
HCT: 42.3 % (ref 36.0–46.0)
Hemoglobin: 14.2 g/dL (ref 12.0–15.0)
MCH: 30.5 pg (ref 26.0–34.0)
MCHC: 33.6 g/dL (ref 30.0–36.0)
MCV: 91 fL (ref 80.0–100.0)
Platelets: 339 10*3/uL (ref 150–400)
RBC: 4.65 MIL/uL (ref 3.87–5.11)
RDW: 12.8 % (ref 11.5–15.5)
WBC: 9.1 10*3/uL (ref 4.0–10.5)
nRBC: 0 % (ref 0.0–0.2)

## 2020-07-03 LAB — COMPREHENSIVE METABOLIC PANEL
ALT: 15 U/L (ref 0–44)
AST: 16 U/L (ref 15–41)
Albumin: 4.4 g/dL (ref 3.5–5.0)
Alkaline Phosphatase: 40 U/L (ref 38–126)
Anion gap: 9 (ref 5–15)
BUN: 12 mg/dL (ref 6–20)
CO2: 23 mmol/L (ref 22–32)
Calcium: 10.5 mg/dL — ABNORMAL HIGH (ref 8.9–10.3)
Chloride: 105 mmol/L (ref 98–111)
Creatinine, Ser: 0.76 mg/dL (ref 0.44–1.00)
GFR calc Af Amer: >60 mL/min (ref >60)
GFR calc non Af Amer: >60 mL/min (ref >60)
Glucose, Bld: 92 mg/dL (ref 70–99)
Potassium: 3.9 mmol/L (ref 3.5–5.1)
Sodium: 137 mmol/L (ref 135–145)
Total Bilirubin: 0.9 mg/dL (ref 0.3–1.2)
Total Protein: 7.2 g/dL (ref 6.5–8.1)

## 2020-07-03 LAB — LIPASE, BLOOD: Lipase: 28 U/L (ref 11–51)

## 2020-07-03 LAB — TROPONIN I (HIGH SENSITIVITY): Troponin I (High Sensitivity): <2 ng/L (ref <18)

## 2020-07-03 NOTE — ED Triage Notes (Signed)
Pt reports epigastric pain, indigestion feeling and intermittent R arm pain and tingling since Wednesday.  Denies SOB, nausea and vomiting.

## 2020-07-03 NOTE — ED Notes (Signed)
Pt left with husband after multiple times of explaining why she hasn't been seen and also spoke with triage nurse multiples instances

## 2020-07-05 LAB — I-STAT BETA HCG BLOOD, ED (MC, WL, AP ONLY): I-stat hCG, quantitative: <5 m[IU]/mL (ref <5)

## 2020-08-18 NOTE — Progress Notes (Deleted)
44 y.o. No obstetric history on file. Married [place] female here for annual exam.    PCP:     No LMP recorded. Patient is perimenopausal.           Sexually active: {yes no:314532}  The current method of family planning is {contraception:315051}.    Exercising: {yes no:314532}  {types:19826} Smoker:  no  Health Maintenance: Pap: 08-18-19 Neg:Neg HR HPV, 04/2017 Neg per patient History of abnormal Pap:  no MMG:  05-19-20 Diag.Rt./stable probably benign Rt.Br.asymmetry/density C/6 mo.f/u rec/BiRads3 Colonoscopy:  *** BMD:  11-19-17  Result :Normal in [place] TDaP:  07/2017 Gardasil:   no HIV: Neg in [place] Hep C: Neg in [place] Screening Labs:  Hb today: ***, Urine today: ***   reports that she has never smoked. She has never used smokeless tobacco. She reports that she does not drink alcohol and does not use drugs.  Past Medical History:  Diagnosis Date  . Elevated ALT measurement 2020  . Essential hypertension   . Family history of breast cancer   . Family history of colon cancer   . Family history of pancreatic cancer   . Family history of uterine cancer   . Hyperlipidemia   . Skin cancer     Past Surgical History:  Procedure Laterality Date  . CESAREAN SECTION  1997  . CHOLECYSTECTOMY    . DG CHOLECYSTOGRAPHY GALL BLADDER ([facility])  2017  . HEMORRHOID SURGERY      Current Outpatient Medications  Medication Sig Dispense Refill  . hydrochlorothiazide (HYDRODIURIL) 25 MG tablet Take 1 tablet (25 mg total) by mouth daily. Needs follow up ov 90 tablet 0  . OVER THE COUNTER MEDICATION Takes Amberen daily    . telmisartan (MICARDIS) 20 MG tablet Take 40 mg by mouth daily.  1   No current facility-administered medications for this visit.    Family History  Problem Relation Age of Onset  . Diabetes Mother   . Hypertension Mother   . High Cholesterol Mother   . Heart attack Father   . Hypertension Father   . High Cholesterol Father   . Kidney disease Father   .  Stroke Father   . Diabetes Father   . Heart disease Father   . Diabetes Daughter   . Depression Daughter   . High blood pressure Sister   . Ovarian cancer Maternal Grandmother 32       ut and ov cancer dx at the same time in erh 60's,  . Uterine cancer Maternal Grandmother 60  . Pancreatic cancer Maternal Grandmother 80       died 1 month after  . Heart attack Maternal Grandfather   . Heart attack Paternal Grandfather   . High blood pressure Sister   . Breast cancer Maternal Aunt 68  . Colon cancer Maternal Uncle 71  . Pancreatic cancer Paternal Grandmother     Review of Systems  Exam:   There were no vitals taken for this visit.    General appearance: alert, cooperative and appears stated age Head: normocephalic, without obvious abnormality, atraumatic Neck: no adenopathy, supple, symmetrical, trachea midline and thyroid normal to inspection and palpation Lungs: clear to auscultation bilaterally Breasts: normal appearance, no masses or tenderness, No nipple retraction or dimpling, No nipple discharge or bleeding, No axillary adenopathy Heart: regular rate and rhythm Abdomen: soft, non-tender; no masses, no organomegaly Extremities: extremities normal, atraumatic, no cyanosis or edema Skin: skin color, texture, turgor normal. No rashes or lesions Lymph nodes: cervical,  supraclavicular, and axillary nodes normal. Neurologic: grossly normal  Pelvic: External genitalia:  no lesions              No abnormal inguinal nodes palpated.              Urethra:  normal appearing urethra with no masses, tenderness or lesions              Bartholins and Skenes: normal                 Vagina: normal appearing vagina with normal color and discharge, no lesions              Cervix: no lesions              Pap taken: {yes no:314532} Bimanual Exam:  Uterus:  normal size, contour, position, consistency, mobility, non-tender              Adnexa: no mass, fullness, tenderness               Rectal exam: {yes no:314532}.  Confirms.              Anus:  normal sphincter tone, no lesions  Chaperone was present for exam.  Assessment:   Well woman visit with normal exam.   Plan: Mammogram screening discussed. Self breast awareness reviewed. Pap and HR HPV as above. Guidelines for Calcium, Vitamin D, regular exercise program including cardiovascular and weight bearing exercise.   Follow up annually and prn.   Additional counseling given.  {yes [id number]. _______ minutes face to face time of which over 50% was spent in counseling.    After visit summary provided.

## 2020-08-22 ENCOUNTER — Ambulatory Visit: Payer: BC Managed Care – PPO | Admitting: Obstetrics and Gynecology

## 2020-12-20 ENCOUNTER — Other Ambulatory Visit: Payer: Self-pay | Admitting: Obstetrics and Gynecology

## 2020-12-20 DIAGNOSIS — Z09 Encounter for follow-up examination after completed treatment for conditions other than malignant neoplasm: Secondary | ICD-10-CM

## 2020-12-22 ENCOUNTER — Ambulatory Visit: Payer: BC Managed Care – PPO | Admitting: Nurse Practitioner

## 2020-12-29 ENCOUNTER — Other Ambulatory Visit: Payer: Self-pay | Admitting: Obstetrics and Gynecology

## 2020-12-29 DIAGNOSIS — N6489 Other specified disorders of breast: Secondary | ICD-10-CM

## 2021-01-03 ENCOUNTER — Other Ambulatory Visit: Payer: Self-pay

## 2021-01-03 ENCOUNTER — Ambulatory Visit
Admission: RE | Admit: 2021-01-03 | Discharge: 2021-01-03 | Disposition: A | Discharge status: Home / Self Care | Payer: 59 | Source: Ambulatory Visit | Attending: Obstetrics and Gynecology | Admitting: Obstetrics and Gynecology

## 2021-01-03 DIAGNOSIS — N6489 Other specified disorders of breast: Secondary | ICD-10-CM

## 2021-02-26 DIAGNOSIS — U071 COVID-19: Secondary | ICD-10-CM

## 2021-02-26 HISTORY — DX: COVID-19: U07.1

## 2021-03-08 ENCOUNTER — Ambulatory Visit: Payer: BC Managed Care – PPO | Admitting: Obstetrics and Gynecology

## 2021-03-29 ENCOUNTER — Ambulatory Visit: Payer: 59 | Admitting: Obstetrics and Gynecology

## 2021-05-08 NOTE — Progress Notes (Signed)
45 y.o. No obstetric history on file. Married Hispanic female here for annual exam.    Difficulty with perimenopause. No menses Nov - March, 2022.  Hot flashes and mood swings.  She did exams showing low hormones in [place].   Uses testosterone topical, estrogen topical, progesterone sublingual from a compounded prescription from [place]. Has enough until about August.  She is doing better on these hormones.  No more hot flashes.  Has gained weight and has decreased memory.  Wakes up tired.   Really wants a pap and declines HPV.  Understands insurance may not pay.   CEA was a little elevated.  Is supposed to see a GI.  PCP is helping with scheduling this.   She saw her PCP 2 weeks ago.   Will see allergist.   She had Covid in May, 2022. Lots of fatigue preceding Covid.   PCP:   Dr. Ruben Gottron  Patient's last menstrual period was 04/25/2021.     Period Cycle (Days): 28 Period Duration (Days): 7 Period Pattern: Regular Menstrual Flow: Moderate Dysmenorrhea: (!) Severe Dysmenorrhea Symptoms: Cramping     Sexually active: Yes.    The current method of family planning is none.    Exercising: Yes.    Home exercise.  Smoker:  no  Health Maintenance: Pap:  08-18-19 normal Neg HPV History of abnormal Pap:  no MMG:  01-03-21 normal, BI-RADS3,  dx due in 1 year.  Colonoscopy:  N/A BMD:   11-19-17  Result  Normal in [place] TDaP:  2018 Gardasil:   no HIV:neg Hep C:neg Screening Labs:  Hb today: PCP, Urine today: PCP   reports that she has never smoked. She has never used smokeless tobacco. She reports that she does not drink alcohol and does not use drugs.  Past Medical History:  Diagnosis Date   Elevated ALT measurement 2020   Essential hypertension    Family history of breast cancer    Family history of colon cancer    Family history of pancreatic cancer    Family history of uterine cancer    Glaucoma    Hyperlipidemia    Skin cancer     Past Surgical History:   Procedure Laterality Date   CESAREAN SECTION  1997   CHOLECYSTECTOMY     DG CHOLECYSTOGRAPHY GALL BLADDER ([facility]X)  2017   HEMORRHOID SURGERY      Current Outpatient Medications  Medication Sig Dispense Refill   hydrochlorothiazide (HYDRODIURIL) 25 MG tablet Take 1 tablet (25 mg total) by mouth daily. Needs follow up ov 90 tablet 0   OVER THE COUNTER MEDICATION Takes Amberen daily     telmisartan (MICARDIS) 20 MG tablet Take 40 mg by mouth daily.  1   timolol (TIMOPTIC) 0.25 % ophthalmic solution 1 drop 2 (two) times daily.     No current facility-administered medications for this visit.    Family History  Problem Relation Age of Onset   Hypertension Mother    High Cholesterol Mother    Heart attack Father    Hypertension Father    High Cholesterol Father    Kidney disease Father    Stroke Father    Diabetes Father    Heart disease Father    High blood pressure Sister    High blood pressure Sister    Breast cancer Maternal Aunt 68   Colon cancer Maternal Uncle 47   Ovarian cancer Maternal Grandmother 30       ut and ov cancer dx at the  same time in erh 60's,   Uterine cancer Maternal Grandmother 60   Pancreatic cancer Maternal Grandmother 80       died 1 month after   Heart attack Maternal Grandfather    Pancreatic cancer Paternal Grandmother    Heart attack Paternal Grandfather    Diabetes Daughter    Depression Daughter     Review of Systems  Genitourinary:        Vaginal discharge   Exam:   BP 118/74 (BP Location: Left Arm, Patient Position: Sitting, Cuff Size: Normal)   Pulse 72   Ht 5\' 3"  (1.6 m)   Wt 146 lb (66.2 kg)   LMP 04/25/2021   SpO2 98%   BMI 25.86 kg/m     General appearance: alert, cooperative and appears stated age Head: normocephalic, without obvious abnormality, atraumatic Neck: no adenopathy, supple, symmetrical, trachea midline and thyroid normal to inspection and palpation Lungs: clear to auscultation bilaterally Breasts: normal  appearance, no masses or tenderness, No nipple retraction or dimpling, No nipple discharge or bleeding, No axillary adenopathy Heart: regular rate and rhythm Abdomen: soft, non-tender; no masses, no organomegaly Extremities: extremities normal, atraumatic, no cyanosis or edema Skin: skin color, texture, turgor normal. No rashes or lesions Lymph nodes: cervical, supraclavicular, and axillary nodes normal. Neurologic: grossly normal  Pelvic: External genitalia:  no lesions              No abnormal inguinal nodes palpated.              Urethra:  normal appearing urethra with no masses, tenderness or lesions              Bartholins and Skenes: normal                 Vagina: normal appearing vagina with normal color and discharge, no lesions              Cervix: no lesions              Pap taken: yes. Bimanual Exam:  Uterus:  normal size, contour, position, consistency, mobility, non-tender              Adnexa: no mass, fullness, tenderness              Rectal exam: No.  Chaperone was present for exam.  Assessment:   Well woman visit with GYN exam. Vaginitis.  Cervical cancer screening. FH of uterine, colon, and breast CA.  Negative genetic testing for patient. Perimenopause.   HTN.  Plan: Mammogram screening discussed. Self breast awareness reviewed. Pap and HR HPV as above. Guidelines for Calcium, Vitamin D, regular exercise program including cardiovascular and weight bearing exercise. Vaginitis testing.  Nuswab. Prometrium and Vivelle Dot Rx.  Reviewed use of HRT which can increase risk of PE, DVT, MI, stroke and breast cancer.  Routine labs and testosterone levels.  Will need compounded testosterone after check her labs.  Fu in 4 months.  Follow up annually and prn.   After visit summary provided.

## 2021-05-12 ENCOUNTER — Encounter: Payer: Self-pay | Admitting: Obstetrics and Gynecology

## 2021-05-12 ENCOUNTER — Ambulatory Visit (INDEPENDENT_AMBULATORY_CARE_PROVIDER_SITE_OTHER): Payer: 59 | Admitting: Obstetrics and Gynecology

## 2021-05-12 ENCOUNTER — Encounter (INDEPENDENT_AMBULATORY_CARE_PROVIDER_SITE_OTHER): Payer: Self-pay

## 2021-05-12 ENCOUNTER — Other Ambulatory Visit: Payer: Self-pay

## 2021-05-12 ENCOUNTER — Other Ambulatory Visit (HOSPITAL_COMMUNITY)
Admission: RE | Admit: 2021-05-12 | Discharge: 2021-05-12 | Disposition: A | Discharge status: Home / Self Care | Payer: 59 | Source: Ambulatory Visit | Attending: Obstetrics and Gynecology | Admitting: Obstetrics and Gynecology

## 2021-05-12 VITALS — BP 118/74 | HR 72 | Ht 63.0 in | Wt 146.0 lb

## 2021-05-12 DIAGNOSIS — N951 Menopausal and female climacteric states: Secondary | ICD-10-CM

## 2021-05-12 DIAGNOSIS — N760 Acute vaginitis: Secondary | ICD-10-CM | POA: Insufficient documentation

## 2021-05-12 DIAGNOSIS — Z01419 Encounter for gynecological examination (general) (routine) without abnormal findings: Secondary | ICD-10-CM | POA: Diagnosis not present

## 2021-05-12 DIAGNOSIS — Z124 Encounter for screening for malignant neoplasm of cervix: Secondary | ICD-10-CM

## 2021-05-12 MED ORDER — PROGESTERONE MICRONIZED 100 MG PO CAPS: 100.0000 mg | ORAL_CAPSULE | Freq: Every day | ORAL | 2 refills | Status: DC

## 2021-05-12 MED ORDER — ESTRADIOL 0.05 MG/24HR TD PTTW: 1.0000 | MEDICATED_PATCH | TRANSDERMAL | 2 refills | Status: DC

## 2021-05-12 NOTE — Patient Instructions (Signed)

## 2021-05-14 ENCOUNTER — Encounter: Payer: Self-pay | Admitting: Obstetrics and Gynecology

## 2021-05-14 ENCOUNTER — Encounter (INDEPENDENT_AMBULATORY_CARE_PROVIDER_SITE_OTHER): Payer: Self-pay

## 2021-05-15 LAB — CERVICOVAGINAL ANCILLARY ONLY
Bacterial Vaginitis (gardnerella): NEGATIVE
Candida Glabrata: NEGATIVE
Candida Vaginitis: NEGATIVE
Comment: NEGATIVE
Comment: NEGATIVE
Comment: NEGATIVE
Comment: NEGATIVE
Trichomonas: NEGATIVE

## 2021-05-16 LAB — CYTOLOGY - PAP: Diagnosis: NEGATIVE

## 2021-05-17 ENCOUNTER — Other Ambulatory Visit: Payer: Self-pay

## 2021-05-17 ENCOUNTER — Other Ambulatory Visit: Payer: 59

## 2021-05-17 LAB — CBC
HCT: 42.9 % (ref 35.0–45.0)
Hemoglobin: 14.3 g/dL (ref 11.7–15.5)
MCH: 31 pg (ref 27.0–33.0)
MCHC: 33.3 g/dL (ref 32.0–36.0)
MCV: 92.9 fL (ref 80.0–100.0)
MPV: 10.9 fL (ref 7.5–12.5)
Platelets: 329 10*3/uL (ref 140–400)
RBC: 4.62 10*6/uL (ref 3.80–5.10)
RDW: 12.4 % (ref 11.0–15.0)
WBC: 7.8 10*3/uL (ref 3.8–10.8)

## 2021-05-17 LAB — COMPREHENSIVE METABOLIC PANEL
AG Ratio: 1.7 (calc) (ref 1.0–2.5)
ALT: 14 U/L (ref 6–29)
AST: 12 U/L (ref 10–35)
Albumin: 4.7 g/dL (ref 3.6–5.1)
Alkaline phosphatase (APISO): 40 U/L (ref 31–125)
BUN: 22 mg/dL (ref 7–25)
CO2: 24 mmol/L (ref 20–32)
Calcium: 10.3 mg/dL — ABNORMAL HIGH (ref 8.6–10.2)
Chloride: 105 mmol/L (ref 98–110)
Creat: 0.68 mg/dL (ref 0.50–0.99)
Globulin: 2.8 g/dL (calc) (ref 1.9–3.7)
Glucose, Bld: 82 mg/dL (ref 65–99)
Potassium: 4.2 mmol/L (ref 3.5–5.3)
Sodium: 139 mmol/L (ref 135–146)
Total Bilirubin: 0.5 mg/dL (ref 0.2–1.2)
Total Protein: 7.5 g/dL (ref 6.1–8.1)

## 2021-05-17 LAB — LIPID PANEL
Cholesterol: 262 mg/dL — ABNORMAL HIGH (ref <200)
HDL: 74 mg/dL (ref >=50)
LDL Cholesterol (Calc): 169 mg/dL (calc) — ABNORMAL HIGH
Non-HDL Cholesterol (Calc): 188 mg/dL (calc) — ABNORMAL HIGH (ref <130)
Total CHOL/HDL Ratio: 3.5 (calc) (ref <5.0)
Triglycerides: 89 mg/dL (ref <150)

## 2021-05-17 LAB — T4, FREE: Free T4: 1.5 ng/dL (ref 0.8–1.8)

## 2021-05-17 LAB — TESTOS,TOTAL,FREE AND SHBG (FEMALE)
Free Testosterone: 2.4 pg/mL (ref 0.1–6.4)
Sex Hormone Binding: 35 nmol/L (ref 17–124)
Testosterone, Total, LC-MS-MS: 15 ng/dL (ref 2–45)

## 2021-05-17 LAB — TSH: TSH: 1.07 mIU/L

## 2021-05-17 LAB — VITAMIN D 25 HYDROXY (VIT D DEFICIENCY, FRACTURES): Vit D, 25-Hydroxy: 56 ng/mL (ref 30–100)

## 2021-05-18 LAB — PTH, INTACT AND CALCIUM
Calcium: 10.6 mg/dL — ABNORMAL HIGH (ref 8.6–10.2)
PTH: 14 pg/mL — ABNORMAL LOW (ref 16–77)

## 2021-05-21 ENCOUNTER — Telehealth: Payer: Self-pay | Admitting: Obstetrics and Gynecology

## 2021-05-21 DIAGNOSIS — R7989 Other specified abnormal findings of blood chemistry: Secondary | ICD-10-CM

## 2021-05-21 NOTE — Telephone Encounter (Signed)
Please let patient know that her testosterone levels are in a normal range.   She will need to specify how much testosterone she is taking from the compounding pharmacy in [place] so that we can try to match this here with her new prescription.  This information is not in her chart at this time.   Her pap was normal.   There is another result note for her to see her PCP or endocrinology regarding her  low PTH and high calcium.

## 2021-05-23 NOTE — Telephone Encounter (Signed)
Dr.Silva patient said she is taking Bioidentical Estradiol 1.5 mg Bioidentical Testosterone 0.50 mg  Apply 1 ml in the morning

## 2021-05-31 ENCOUNTER — Other Ambulatory Visit: Payer: Self-pay

## 2021-05-31 MED ORDER — NONFORMULARY OR COMPOUNDED ITEM
0 refills | Status: DC
Start: 2021-05-31 — End: 2022-01-18

## 2021-05-31 NOTE — Telephone Encounter (Signed)
Sent patient a My Chart message regarding the Testosterone Rx.  I set it to return to me tomorrow if not read.

## 2021-05-31 NOTE — Telephone Encounter (Signed)
I am recommending Testosterone cream 1%, Apply 0.5 grams to thigh or lower abdomen daily.  Disp:  30 grams RF:  none.   I recommend an office visit and lab work to check testosterone levels 6 weeks after starting the new prescription.   The prescription will need to be sent to [facility] or [facility].

## 2021-06-05 ENCOUNTER — Encounter: Payer: Self-pay | Admitting: Obstetrics and Gynecology

## 2021-06-05 NOTE — Telephone Encounter (Signed)
Please let the patient know that the estrogen and progesterone I prescribed are bioidentical.   If she is having hot flashes, she likely needs more estrogen.  I would recommend increasing to Vivelle Dot patch 0.1 mg twice weekly.   She will also need to continue the Prometrium 100 mg q hs.   Refills of each until annual exam is due.

## 2021-06-06 MED ORDER — ESTRADIOL 0.1 MG/24HR TD PTTW: 1.0000 | MEDICATED_PATCH | TRANSDERMAL | 2 refills | Status: DC

## 2021-07-08 NOTE — Progress Notes (Signed)
GYNECOLOGY  VISIT   HPI: 45 y.o.   Married  Hispanic  female   G1P1001 with No LMP recorded (approximate). Patient is perimenopausal.   here for follow-up for HRT.   LMP was June, 2022.  This was bleeding after starting compounded HRT in [place] in April, 2022. Brady 101 and estradiol 11 on 01/31/21 in [place].   She started on a new regimen of Vivelle Dot 0.1 mg twice weekly and Prometrium 100 mg q hs after being seen here for her annual exam on 05/12/21.   Sleeping well.  Feeling more tranquil.  Increased energy. Walking and will start pilates.  Wants to loose weight and control her cholesterol. Feels tired at the end of the afternoon.  Still having some hot flashes that is a shock to her.   She will see endocrinology in October regarding her elevated calcium and low PTH noted on 05/17/21.   No longer has allergies since she had Covid.   Planning to be in [place] from November to February.   GYNECOLOGIC HISTORY: No LMP recorded (approximate). Patient is perimenopausal. Contraception:  None Menopausal hormone therapy: estradiol patch 0.'1mg'$  twice weekly and Prometrium 100 mg PO daily Last mammogram:  01/03/21 at [facility]; Bilateral Dx MMG; Stable UPPER RIGHT breast asymmetry, previously without sonographic correlate. One additional follow-up in 1 year is recommended to ensure 2 year stability Last pap smear:   05/12/21, neg        OB History     Gravida  1   Para  1   Term  1   Preterm      AB      Living  1      SAB      IAB      Ectopic      Multiple      Live Births                 Patient Active Problem List   Diagnosis Date Noted   Genetic testing 09/11/2018   Family history of breast cancer    Family history of colon cancer    Family history of uterine cancer    Family history of pancreatic cancer    Essential hypertension 05/16/2018   Abnormal uterine bleeding (AUB) 05/16/2018    Past Medical History:  Diagnosis Date   COVID 02/2021   Elevated ALT  measurement 2020   Essential hypertension    Family history of breast cancer    Family history of colon cancer    Family history of pancreatic cancer    Family history of uterine cancer    Glaucoma    Hyperlipidemia    Skin cancer     Past Surgical History:  Procedure Laterality Date   CESAREAN SECTION  1997   CHOLECYSTECTOMY     DG CHOLECYSTOGRAPHY GALL BLADDER ([place] HX)  2017   HEMORRHOID SURGERY      Current Outpatient Medications  Medication Sig Dispense Refill   estradiol (VIVELLE-DOT) 0.1 MG/24HR patch Place 1 patch (0.1 mg total) onto the skin 2 (two) times a week. 8 patch 2   hydrochlorothiazide (HYDRODIURIL) 25 MG tablet Take 1 tablet (25 mg total) by mouth daily. Needs follow up ov 90 tablet 0   NONFORMULARY OR COMPOUNDED ITEM Testosterone cream 1%.  Apply 0.5 grams to thigh or lower abdomen daily.  Rotate site. Disp:  30 grams, Refills:  none. 30 each 0   OVER THE COUNTER MEDICATION Takes Amberen daily  progesterone (PROMETRIUM) 100 MG capsule Take 1 capsule (100 mg total) by mouth daily. 30 capsule 2   telmisartan (MICARDIS) 20 MG tablet Take 40 mg by mouth daily.  1   timolol (TIMOPTIC) 0.25 % ophthalmic solution 1 drop 2 (two) times daily.     No current facility-administered medications for this visit.     ALLERGIES: Dipyrone, Eggs or egg-derived products, and Iodine  Family History  Problem Relation Age of Onset   Hypertension Mother    High Cholesterol Mother    Heart attack Father    Hypertension Father    High Cholesterol Father    Kidney disease Father    Stroke Father    Diabetes Father    Heart disease Father    High blood pressure Sister    High blood pressure Sister    Breast cancer Maternal Aunt 68   Colon cancer Maternal Uncle 15   Ovarian cancer Maternal Grandmother 60       ut and ov cancer dx at the same time in erh 60's,   Uterine cancer Maternal Grandmother 60   Pancreatic cancer Maternal Grandmother 17       died 1 month after    Heart attack Maternal Grandfather    Pancreatic cancer Paternal Grandmother    Heart attack Paternal Grandfather    Diabetes Daughter    Depression Daughter     Social History   Socioeconomic History   Marital status: Married    Spouse name: Not on file   Number of children: Not on file   Years of education: Not on file   Highest education level: Not on file  Occupational History   Not on file  Tobacco Use   Smoking status: Never   Smokeless tobacco: Never  Substance and Sexual Activity   Alcohol use: Never   Drug use: Never   Sexual activity: Yes    Birth control/protection: None  Other Topics Concern   Not on file  Social History Narrative   Not on file   Social Determinants of Health   Financial Resource Strain: Not on file  Food Insecurity: Not on file  Transportation Needs: Not on file  Physical Activity: Not on file  Stress: Not on file  Social Connections: Not on file  Intimate Partner Violence: Not on file    Review of Systems  Reason unable to perform ROS: Hot Flashes.   PHYSICAL EXAMINATION:    BP 108/62   Pulse 78   Resp 18   LMP  (Approximate) Comment: June 2022    General appearance: alert, cooperative and appears stated age   ASSESSMENT  Menopausal symptoms.  HRT.  Doing well overall.  On testosterone cream.  Elevated calcium and low PTH.   PLAN  She will continue current HRT therapy.  She will see endocrinology in October.  She will have her testosterone level checked when she travels to [place] in November.  Annual exam in July, 2023 and follow up prn.    An After Visit Summary was printed and given to the patient.  36 min  total time was spent for this patient encounter, including preparation, face-to-face counseling with the patient, coordination of care, and documentation of the encounter.

## 2021-07-10 ENCOUNTER — Ambulatory Visit: Payer: 59 | Admitting: Obstetrics and Gynecology

## 2021-07-10 ENCOUNTER — Other Ambulatory Visit: Payer: Self-pay

## 2021-07-10 ENCOUNTER — Encounter: Payer: Self-pay | Admitting: Obstetrics and Gynecology

## 2021-07-10 VITALS — BP 108/62 | HR 78 | Resp 18

## 2021-07-10 DIAGNOSIS — Z79890 Hormone replacement therapy (postmenopausal): Secondary | ICD-10-CM | POA: Diagnosis not present

## 2021-07-10 MED ORDER — PROGESTERONE MICRONIZED 100 MG PO CAPS: 100.0000 mg | ORAL_CAPSULE | Freq: Every day | ORAL | 1 refills | Status: DC

## 2021-07-10 MED ORDER — ESTRADIOL 0.1 MG/24HR TD PTTW: 1.0000 | MEDICATED_PATCH | TRANSDERMAL | 1 refills | Status: DC

## 2021-08-03 ENCOUNTER — Other Ambulatory Visit: Payer: Self-pay

## 2021-08-03 ENCOUNTER — Ambulatory Visit: Payer: 59 | Admitting: Endocrinology

## 2021-08-03 ENCOUNTER — Encounter: Payer: Self-pay | Admitting: Endocrinology

## 2021-08-03 MED ORDER — TELMISARTAN 80 MG PO TABS: 80.0000 mg | ORAL_TABLET | Freq: Every day | ORAL | 3 refills | Status: DC

## 2021-08-03 MED ORDER — HYDROCHLOROTHIAZIDE 25 MG PO TABS: 12.5000 mg | ORAL_TABLET | Freq: Every day | ORAL | 5 refills | Status: DC

## 2021-08-03 NOTE — Progress Notes (Signed)
Subjective:    Patient ID: Ashley Shepard, female    DOB: 07-Jun-1976, 45 y.o.   MRN: [id number]  HPI Husb translates.  Pt is referred by Dr Quincy Simmonds, for hypercalcemia.  Pt was noted to have hypercalcemia in 2022. she has never had osteoporosis, urolithiasis, thyroid probs, parathyroid probs, sarcoidosis, PUD, pancreatitis, or bony fracture.  He does not take vitamin-A supplement. Pt denies taking antacids, Li++.  She takes Vit-D, uncertain dosage. She has anxiety, weight gain, and hot flashes.  She says telmisartan-hydrochlorothiazide is 40-12.5 mg per day.    Past Medical History:  Diagnosis Date   COVID 02/2021   Elevated ALT measurement 2020   Essential hypertension    Family history of breast cancer    Family history of colon cancer    Family history of pancreatic cancer    Family history of uterine cancer    Glaucoma    Hyperlipidemia    Skin cancer     Past Surgical History:  Procedure Laterality Date   CESAREAN SECTION  1997   CHOLECYSTECTOMY     DG CHOLECYSTOGRAPHY GALL BLADDER ([place] HX)  2017   HEMORRHOID SURGERY      Social History   Socioeconomic History   Marital status: Married    Spouse name: Not on file   Number of children: Not on file   Years of education: Not on file   Highest education level: Not on file  Occupational History   Not on file  Tobacco Use   Smoking status: Never   Smokeless tobacco: Never  Substance and Sexual Activity   Alcohol use: Never   Drug use: Never   Sexual activity: Yes    Birth control/protection: None  Other Topics Concern   Not on file  Social History Narrative   Not on file   Social Determinants of Health   Financial Resource Strain: Not on file  Food Insecurity: Not on file  Transportation Needs: Not on file  Physical Activity: Not on file  Stress: Not on file  Social Connections: Not on file  Intimate Partner Violence: Not on file    Current Outpatient Medications on File Prior to Visit   Medication Sig Dispense Refill   estradiol (VIVELLE-DOT) 0.1 MG/24HR patch Place 1 patch (0.1 mg total) onto the skin 2 (two) times a week. 24 patch 1   NONFORMULARY OR COMPOUNDED ITEM Testosterone cream 1%.  Apply 0.5 grams to thigh or lower abdomen daily.  Rotate site. Disp:  30 grams, Refills:  none. 30 each 0   OVER THE COUNTER MEDICATION Takes Amberen daily     progesterone (PROMETRIUM) 100 MG capsule Take 1 capsule (100 mg total) by mouth daily. 90 capsule 1   timolol (TIMOPTIC) 0.25 % ophthalmic solution 1 drop 2 (two) times daily.     No current facility-administered medications on file prior to visit.    Allergies  Allergen Reactions   Dipyrone Itching and Swelling    Itch and swelling Blood pressure gets low Throat swelling   Eggs Or Egg-Derived Products     Stomach pain.    Iodine     Itch and swelling     Family History  Problem Relation Age of Onset   Hypertension Mother    High Cholesterol Mother    Heart attack Father    Hypertension Father    High Cholesterol Father    Kidney disease Father    Stroke Father    Diabetes Father    Heart  disease Father    High blood pressure Sister    High blood pressure Sister    Ovarian cancer Maternal Grandmother 60       ut and ov cancer dx at the same time in erh 60's,   Uterine cancer Maternal Grandmother 60   Pancreatic cancer Maternal Grandmother 77       died 1 month after   Heart attack Maternal Grandfather    Pancreatic cancer Paternal Grandmother    Heart attack Paternal Grandfather    Diabetes Daughter    Depression Daughter    Breast cancer Maternal Aunt 75   Colon cancer Maternal Uncle 45   Hypercalcemia Neg Hx     BP 140/80 (BP Location: Right Arm, Patient Position: Sitting, Cuff Size: Normal)   Pulse 84   Ht 5\' 3"  (1.6 m)   Wt 148 lb 6.4 oz (67.3 kg)   SpO2 99%   BMI 26.29 kg/m    Review of Systems Denies back pain.      Objective:   Physical Exam VS: see vs page GEN: no distress HEAD:  head: no deformity eyes: no periorbital swelling, no proptosis external nose and ears are normal NECK: supple, thyroid is not enlarged CHEST WALL: no kyphosis LUNGS: clear to auscultation CV: reg rate and rhythm, no murmur.  MUSCULOSKELETAL: gait is normal and steady EXTEMITIES: no deformity.  no leg edema NEURO:  readily moves all 4's.  sensation is intact to touch on all 4's SKIN:  Normal texture and temperature.  No rash or suspicious lesion is visible.   NODES:  None palpable at the neck PSYCH: alert, well-oriented.  Does not appear anxious nor depressed.    Lab Results  Component Value Date   TSH 1.07 05/12/2021    Lab Results  Component Value Date   PTH 14 (L) 05/17/2021   CALCIUM 10.6 (H) 05/17/2021   25-OH vit-D=56  Lab Results  Component Value Date   ALT 14 05/12/2021   AST 12 05/12/2021   ALKPHOS 40 07/03/2020   BILITOT 0.5 05/12/2021   DEXA (2019): worst T-score was -0.8 (RFN)  Lab Results  Component Value Date   CREATININE 0.68 05/12/2021   BUN 22 05/12/2021   NA 139 05/12/2021   K 4.2 05/12/2021   CL 105 05/12/2021   CO2 24 05/12/2021   I have reviewed outside records, and summarized: Pt was noted to have elevated Ca++, and referred here.  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  Patient Instructions  I have sent a prescription to your pharmacy, to double the telmisartan, and half the HCTZ.   Please come back for a follow-up appointment in 4-6 weeks, with blood tests prior.   Please call sooner id the blood pressure goes over 150/95.    Enviei uma receita para sua farmcia, para dobrar o telmisartan e metade do HCTZ. Por favor, volte para uma consulta de acompanhamento em 4-6 semanas, com exames de sangue prvios. Por favor, ligue mais cedo se a presso arterial ultrapassar 150/95.

## 2021-08-03 NOTE — Patient Instructions (Addendum)
I have sent a prescription to your pharmacy, to double the telmisartan, and half the HCTZ.   Please come back for a follow-up appointment in 4-6 weeks, with blood tests prior.   Please call sooner id the blood pressure goes over 150/95.    Enviei uma receita para sua farmcia, para dobrar o telmisartan e metade do HCTZ. Por favor, volte para uma consulta de acompanhamento em 4-6 semanas, com exames de sangue prvios. Por favor, ligue mais cedo se a presso arterial ultrapassar 150/95.

## 2021-08-08 LAB — VITAMIN D 1,25 DIHYDROXY
Vitamin D 1, 25 (OH)2 Total: 39 pg/mL (ref 18–72)
Vitamin D2 1, 25 (OH)2: <8 pg/mL
Vitamin D3 1, 25 (OH)2: 39 pg/mL

## 2021-08-08 LAB — VITAMIN A: Vitamin A (Retinoic Acid): 67 ug/dL (ref 38–98)

## 2021-08-08 LAB — PTH, INTACT AND CALCIUM
Calcium: 10.2 mg/dL (ref 8.6–10.2)
PTH: 16 pg/mL (ref 16–77)

## 2021-08-08 LAB — PTH-RELATED PEPTIDE: PTH-Related Protein (PTH-RP): 13 pg/mL (ref 11–20)

## 2021-08-25 ENCOUNTER — Other Ambulatory Visit: Payer: 59

## 2021-08-31 ENCOUNTER — Ambulatory Visit: Payer: 59 | Admitting: Endocrinology

## 2021-12-07 ENCOUNTER — Other Ambulatory Visit: Payer: Self-pay

## 2021-12-07 NOTE — Telephone Encounter (Signed)
AEX 05/12/21  At 07/10/21 office visit note: "PLAN   She will continue current HRT therapy.  She will see endocrinology in October.  She will have her testosterone level checked when she travels to [place] in November.  Annual exam in July, 2023 and follow up prn. "

## 2021-12-09 MED ORDER — ESTRADIOL 0.1 MG/24HR TD PTTW: 1.0000 | MEDICATED_PATCH | TRANSDERMAL | 1 refills | Status: DC

## 2022-01-08 ENCOUNTER — Other Ambulatory Visit: Payer: Self-pay | Admitting: Obstetrics and Gynecology

## 2022-01-08 DIAGNOSIS — Z09 Encounter for follow-up examination after completed treatment for conditions other than malignant neoplasm: Secondary | ICD-10-CM

## 2022-01-16 NOTE — Progress Notes (Signed)
GYNECOLOGY  VISIT ?  ?HPI: ?46 y.o.   Married  Hispanic  female   ?G1P1001 with Patient's last menstrual period was 01/08/2022 (exact date).   ?here for history of 2 UTIs while in [place] recently. ?Last week she was having dysuria, but some better now. ?She also had yeast infection in [place].  ? ?Menses have been irregular and light but last 4-5 days.  ?Menses December, January, February, and March. ?She is also having ovulatory bleeding.  This lasts 4 - 5 days.  ? ?She is using transdermal estrogen patch and using Prometrium 100 mg nightly.  ?Forgets to use her testosterone cream.  ? ?Does feel more sad.  ?Has been on Effexor through her PCP and increased dose now to 75 mg. ? ?In the am, she feels dizziness and some nausea. ? ?Sprained her ankle recently.  ? ?GYNECOLOGIC HISTORY: ?Patient's last menstrual period was 01/08/2022 (exact date). ?Contraception:  None ?Menopausal hormone therapy: Vivelle Dot, Testosterone Cream, Progesterone '100mg'$  ?Last mammogram:  01/03/21 at [facility]; Bilateral Dx MMG; Stable UPPER RIGHT breast asymmetry, previously withoutsonographic correlate. One additional follow-up in 1 year isrecommended to ensure 2 year stability. ?Last pap smear:  05-12-21 Neg, 08-18-19 Neg:Neg HR HPV ?       ?OB History   ? ? Gravida  ?1  ? Para  ?1  ? Term  ?1  ? Preterm  ?   ? AB  ?   ? Living  ?1  ?  ? ? SAB  ?   ? IAB  ?   ? Ectopic  ?   ? Multiple  ?   ? Live Births  ?   ?   ?  ?  ?    ? ?Patient Active Problem List  ? Diagnosis Date Noted  ? Hypercalcemia 08/03/2021  ? Genetic testing 09/11/2018  ? Family history of breast cancer   ? Family history of colon cancer   ? Family history of uterine cancer   ? Family history of pancreatic cancer   ? Essential hypertension 05/16/2018  ? Abnormal uterine bleeding (AUB) 05/16/2018  ? ? ?Past Medical History:  ?Diagnosis Date  ? COVID 02/2021  ? Elevated ALT measurement 2020  ? Essential hypertension   ? Family history of breast cancer   ? Family history of colon cancer    ? Family history of pancreatic cancer   ? Family history of uterine cancer   ? Glaucoma   ? Hyperlipidemia   ? Skin cancer   ? ? ?Past Surgical History:  ?Procedure Laterality Date  ? [place]  ? CHOLECYSTECTOMY    ? DG CHOLECYSTOGRAPHY GALL BLADDER ([place] HX)  2017  ? HEMORRHOID SURGERY    ? ? ?Current Outpatient Medications  ?Medication Sig Dispense Refill  ? estradiol (VIVELLE-DOT) 0.1 MG/24HR patch Place 1 patch (0.1 mg total) onto the skin 2 (two) times a week. 24 patch 1  ? LINZESS 145 MCG CAPS capsule Take 145 mcg by mouth daily.    ? NONFORMULARY OR COMPOUNDED ITEM Testosterone cream 1%.  Apply 0.5 grams to thigh or lower abdomen daily.  Rotate site. Disp:  30 grams, Refills:  none. 30 each 0  ? OVER THE COUNTER MEDICATION Takes Amberen daily    ? progesterone (PROMETRIUM) 100 MG capsule Take 1 capsule (100 mg total) by mouth daily. 90 capsule 1  ? progesterone (PROMETRIUM) 100 MG capsule Take 1 capsule by mouth daily.    ? telmisartan-hydrochlorothiazide (MICARDIS HCT) 40-12.5  MG tablet Take 1 tablet by mouth daily.    ? timolol (TIMOPTIC) 0.25 % ophthalmic solution 1 drop 2 (two) times daily.    ? timolol (TIMOPTIC) 0.5 % ophthalmic solution 1 drop 2 (two) times daily.    ? Venlafaxine HCl 75 MG TB24 Take 1 tablet by mouth daily.    ? ?No current facility-administered medications for this visit.  ?  ? ?ALLERGIES: Dipyrone, Eggs or egg-derived products, Iodine, and Latex ? ?Family History  ?Problem Relation Age of Onset  ? Hypertension Mother   ? High Cholesterol Mother   ? Heart attack Father   ? Hypertension Father   ? High Cholesterol Father   ? Kidney disease Father   ? Stroke Father   ? Diabetes Father   ? Heart disease Father   ? High blood pressure Sister   ? High blood pressure Sister   ? Ovarian cancer Maternal Grandmother 60  ?     ut and ov cancer dx at the same time in erh 60's,  ? Uterine cancer Maternal Grandmother 60  ? Pancreatic cancer Maternal Grandmother 4  ?     died 30  month after  ? Heart attack Maternal Grandfather   ? Pancreatic cancer Paternal Grandmother   ? Heart attack Paternal Grandfather   ? Diabetes Daughter   ? Depression Daughter   ? Breast cancer Maternal Aunt 38  ? Colon cancer Maternal Uncle 25  ? Hypercalcemia Neg Hx   ? ? ?Social History  ? ?Socioeconomic History  ? Marital status: Married  ?  Spouse name: Not on file  ? Number of children: Not on file  ? Years of education: Not on file  ? Highest education level: Not on file  ?Occupational History  ? Not on file  ?Tobacco Use  ? Smoking status: Never  ? Smokeless tobacco: Never  ?Substance and Sexual Activity  ? Alcohol use: Never  ? Drug use: Never  ? Sexual activity: Yes  ?  Birth control/protection: None  ?Other Topics Concern  ? Not on file  ?Social History Narrative  ? Not on file  ? ?Social Determinants of Health  ? ?Financial Resource Strain: Not on file  ?Food Insecurity: Not on file  ?Transportation Needs: Not on file  ?Physical Activity: Not on file  ?Stress: Not on file  ?Social Connections: Not on file  ?Intimate Partner Violence: Not on file  ? ? ?Review of Systems  ?Genitourinary:  Positive for dysuria and menstrual problem (irregular bleeding).  ?All other systems reviewed and are negative. ? ?PHYSICAL EXAMINATION:   ? ?BP 112/78   Pulse 70   Ht '5\' 4"'$  (1.626 m)   LMP 01/08/2022 (Exact Date)   BMI 25.47 kg/m?     ?General appearance: alert, cooperative and appears stated age ?  ?Pelvic: External genitalia:  no lesions ?             Urethra:  normal appearing urethra with no masses, tenderness or lesions ?             Bartholins and Skenes: normal    ?             Vagina: normal appearing vagina with normal color and discharge, no lesions ?             Cervix: no lesions ?               ?Bimanual Exam:  Uterus:  normal size, contour, position, consistency, mobility, non-tender ?  Adnexa: no mass, fullness, tenderness ?    ?Chaperone was present for exam:  yes. ? ?ASSESSMENT ? ?Dysuria.   ?Irregular bleeding.  ?Perimenopausal female.  ?Some depression symptoms.  ?On Effexor.  ? ?PLAN ? ?Urinalysis:  sg 1.025, pH 5, 0 - 5 WBC, NS RBC, > 60 squams, many bacteria.  ?UC sent.  ?Declines tx until UC is back.  ?Check FSH.  ?If she is not menopausal, I would recommend discontinuing her HRT and continuing the Effexor.  ?Fu for annual exam and prn. ?  ?An After Visit Summary was printed and given to the patient. ? ?30 min  total time was spent for this patient encounter, including preparation, face-to-face counseling with the patient, coordination of care, and documentation of the encounter. ? ? ?  ? ?

## 2022-01-17 ENCOUNTER — Ambulatory Visit
Admission: RE | Admit: 2022-01-17 | Discharge: 2022-01-17 | Disposition: A | Discharge status: Home / Self Care | Payer: 59 | Source: Ambulatory Visit | Attending: Obstetrics and Gynecology | Admitting: Obstetrics and Gynecology

## 2022-01-17 ENCOUNTER — Other Ambulatory Visit: Payer: Self-pay

## 2022-01-17 ENCOUNTER — Ambulatory Visit: Payer: 59 | Admitting: Obstetrics and Gynecology

## 2022-01-17 VITALS — BP 112/78 | HR 70 | Ht 64.0 in

## 2022-01-17 DIAGNOSIS — N926 Irregular menstruation, unspecified: Secondary | ICD-10-CM

## 2022-01-17 DIAGNOSIS — Z09 Encounter for follow-up examination after completed treatment for conditions other than malignant neoplasm: Secondary | ICD-10-CM

## 2022-01-17 DIAGNOSIS — R300 Dysuria: Secondary | ICD-10-CM | POA: Diagnosis not present

## 2022-01-17 DIAGNOSIS — R4589 Other symptoms and signs involving emotional state: Secondary | ICD-10-CM | POA: Diagnosis not present

## 2022-01-17 LAB — PREGNANCY, URINE: Preg Test, Ur: NEGATIVE

## 2022-01-18 ENCOUNTER — Encounter: Payer: Self-pay | Admitting: Obstetrics and Gynecology

## 2022-01-18 LAB — URINALYSIS, COMPLETE W/RFL CULTURE
Bilirubin Urine: NEGATIVE
Glucose, UA: NEGATIVE
Hgb urine dipstick: NEGATIVE
Hyaline Cast: NONE SEEN /LPF
Ketones, ur: NEGATIVE
Nitrites, Initial: NEGATIVE
Protein, ur: NEGATIVE
RBC / HPF: NONE SEEN /HPF (ref 0–2)
Specific Gravity, Urine: 1.025 (ref 1.001–1.035)
Squamous Epithelial / HPF: >=60 /HPF — AB (ref <=5)
pH: 5 (ref 5.0–8.0)

## 2022-01-18 LAB — URINE CULTURE
MICRO NUMBER:: 13164607
SPECIMEN QUALITY:: ADEQUATE

## 2022-01-18 LAB — CULTURE INDICATED

## 2022-01-18 LAB — FOLLICLE STIMULATING HORMONE: FSH: 17.4 m[IU]/mL

## 2022-01-23 ENCOUNTER — Encounter: Payer: Self-pay | Admitting: Obstetrics and Gynecology

## 2022-01-23 NOTE — Telephone Encounter (Signed)
Patient replied to your my chart message. ? ?Hi [place],  ?  ?Your final urine culture is considered negative for bladder infection.  ?  ?Your [place] hormone level is in a perimenopausal range.  ?I would have you consider stopping the hormone treatments to see if you feel better off of them and to see if your menstrual bleeding normalizes. ?  ?If you are not doing well off of the hormones or if the irregular bleeding pattern continues, please let me know.  ?  ?Please contact the office for any questions.  ?  ?Have a good weekend! ?  ?Josefa Half, MD ?

## 2022-02-23 ENCOUNTER — Other Ambulatory Visit: Payer: Self-pay

## 2022-02-23 NOTE — Telephone Encounter (Signed)
Per msg from 01/23/22: pt has stopped HRTs.  ?

## 2022-03-01 ENCOUNTER — Other Ambulatory Visit: Payer: Self-pay

## 2022-03-01 NOTE — Telephone Encounter (Signed)
Please contact patient to see how she is doing with her perimenopause.  ? ?I thought that she was possibly going to stop the hormones, but I am not sure what her goals are at this time.  ? ?I will decline to refill the Prometrium until I hear from her. ?

## 2022-03-01 NOTE — Telephone Encounter (Signed)
Last AEX 05/12/21--nothing scheduled ?Last mammo 01/17/22.  ?

## 2022-03-02 NOTE — Telephone Encounter (Signed)
Left detailed VM per DPR to return call with info.  ?

## 2022-03-06 ENCOUNTER — Other Ambulatory Visit: Payer: Self-pay | Admitting: Obstetrics and Gynecology

## 2022-03-06 ENCOUNTER — Encounter: Payer: Self-pay | Admitting: Obstetrics and Gynecology

## 2022-03-06 MED ORDER — PROGESTERONE MICRONIZED 100 MG PO CAPS: 100.0000 mg | ORAL_CAPSULE | Freq: Every day | ORAL | 0 refills | Status: DC

## 2022-03-06 MED ORDER — ESTRADIOL 0.1 MG/24HR TD PTTW: 1.0000 | MEDICATED_PATCH | TRANSDERMAL | 0 refills | Status: DC

## 2022-05-14 NOTE — Progress Notes (Unsigned)
46 y.o. G38P1001 Married {Race/ethnicity:17218} female here for annual exam.    PCP:     No LMP recorded. Patient is perimenopausal.           Sexually active: {yes no:314532}  The current method of family planning is {contraception:315051}.    Exercising: {yes no:314532}  {types:19826} Smoker:  {YES NO:22349}  Health Maintenance: Pap:  *** History of abnormal Pap:  {YES NO:22349} MMG:  *** Colonoscopy:  *** BMD:   ***  Result  *** TDaP:  *** Gardasil:   {YES NO:22349} HIV: Hep C: Screening Labs:  Hb today: ***, Urine today: ***   reports that she has never smoked. She has never used smokeless tobacco. She reports that she does not drink alcohol and does not use drugs.  Past Medical History:  Diagnosis Date   COVID 02/2021   Elevated ALT measurement 2020   Essential hypertension    Family history of breast cancer    Family history of colon cancer    Family history of pancreatic cancer    Family history of uterine cancer    Glaucoma    Hyperlipidemia    Skin cancer     Past Surgical History:  Procedure Laterality Date   CESAREAN SECTION  1997   CHOLECYSTECTOMY     DG CHOLECYSTOGRAPHY GALL BLADDER ([place] HX)  2017   HEMORRHOID SURGERY      Current Outpatient Medications  Medication Sig Dispense Refill   estradiol (VIVELLE-DOT) 0.1 MG/24HR patch Place 1 patch (0.1 mg total) onto the skin 2 (two) times a week. 24 patch 0   LINZESS 145 MCG CAPS capsule Take 145 mcg by mouth daily.     OVER THE COUNTER MEDICATION Takes Amberen daily     progesterone (PROMETRIUM) 100 MG capsule Take 1 capsule (100 mg total) by mouth daily. Take at night before bed. 90 capsule 0   telmisartan-hydrochlorothiazide (MICARDIS HCT) 40-12.5 MG tablet Take 1 tablet by mouth daily.     timolol (TIMOPTIC) 0.25 % ophthalmic solution 1 drop 2 (two) times daily.     timolol (TIMOPTIC) 0.5 % ophthalmic solution 1 drop 2 (two) times daily.     Venlafaxine HCl 75 MG TB24 Take 1 tablet by mouth daily.      No current facility-administered medications for this visit.    Family History  Problem Relation Age of Onset   Hypertension Mother    High Cholesterol Mother    Heart attack Father    Hypertension Father    High Cholesterol Father    Kidney disease Father    Stroke Father    Diabetes Father    Heart disease Father    High blood pressure Sister    High blood pressure Sister    Ovarian cancer Maternal Grandmother 14       ut and ov cancer dx at the same time in erh 75's,   Uterine cancer Maternal Grandmother 60   Pancreatic cancer Maternal Grandmother 64       died 1 month after   Heart attack Maternal Grandfather    Pancreatic cancer Paternal Grandmother    Heart attack Paternal Grandfather    Diabetes Daughter    Depression Daughter    Breast cancer Maternal Aunt 68   Colon cancer Maternal Uncle 45   Hypercalcemia Neg Hx     Review of Systems  Exam:   There were no vitals taken for this visit.    General appearance: alert, cooperative and appears stated age Head:  normocephalic, without obvious abnormality, atraumatic Neck: no adenopathy, supple, symmetrical, trachea midline and thyroid normal to inspection and palpation Lungs: clear to auscultation bilaterally Breasts: normal appearance, no masses or tenderness, No nipple retraction or dimpling, No nipple discharge or bleeding, No axillary adenopathy Heart: regular rate and rhythm Abdomen: soft, non-tender; no masses, no organomegaly Extremities: extremities normal, atraumatic, no cyanosis or edema Skin: skin color, texture, turgor normal. No rashes or lesions Lymph nodes: cervical, supraclavicular, and axillary nodes normal. Neurologic: grossly normal  Pelvic: External genitalia:  no lesions              No abnormal inguinal nodes palpated.              Urethra:  normal appearing urethra with no masses, tenderness or lesions              Bartholins and Skenes: normal                 Vagina: normal appearing  vagina with normal color and discharge, no lesions              Cervix: no lesions              Pap taken: {yes no:314532} Bimanual Exam:  Uterus:  normal size, contour, position, consistency, mobility, non-tender              Adnexa: no mass, fullness, tenderness              Rectal exam: {yes no:314532}.  Confirms.              Anus:  normal sphincter tone, no lesions  Chaperone was present for exam:  ***  Assessment:   Well woman visit with gynecologic exam.   Plan: Mammogram screening discussed. Self breast awareness reviewed. Pap and HR HPV as above. Guidelines for Calcium, Vitamin D, regular exercise program including cardiovascular and weight bearing exercise.   Follow up annually and prn.   Additional counseling given.  {yes [id number]. _______ minutes face to face time of which over 50% was spent in counseling.    After visit summary provided.

## 2022-05-15 ENCOUNTER — Ambulatory Visit (INDEPENDENT_AMBULATORY_CARE_PROVIDER_SITE_OTHER): Payer: 59 | Admitting: Obstetrics and Gynecology

## 2022-05-15 ENCOUNTER — Encounter: Payer: Self-pay | Admitting: Obstetrics and Gynecology

## 2022-05-15 VITALS — BP 128/68 | HR 84 | Ht 63.0 in | Wt 143.0 lb

## 2022-05-15 DIAGNOSIS — Z01419 Encounter for gynecological examination (general) (routine) without abnormal findings: Secondary | ICD-10-CM | POA: Diagnosis not present

## 2022-05-15 DIAGNOSIS — N760 Acute vaginitis: Secondary | ICD-10-CM

## 2022-05-15 LAB — WET PREP FOR TRICH, YEAST, CLUE

## 2022-05-15 MED ORDER — PROGESTERONE MICRONIZED 100 MG PO CAPS: 100.0000 mg | ORAL_CAPSULE | Freq: Every day | ORAL | 3 refills | Status: DC

## 2022-05-15 MED ORDER — ESTRADIOL 0.1 MG/24HR TD PTTW: 1.0000 | MEDICATED_PATCH | TRANSDERMAL | 3 refills | Status: DC

## 2022-05-15 MED ORDER — FLUCONAZOLE 150 MG PO TABS: 150.0000 mg | ORAL_TABLET | Freq: Once | ORAL | 0 refills | Status: AC

## 2022-05-15 MED ORDER — NYSTATIN-TRIAMCINOLONE 100000-0.1 UNIT/GM-% EX OINT
1.0000 | TOPICAL_OINTMENT | Freq: Two times a day (BID) | CUTANEOUS | 0 refills | Status: DC
Start: 2022-05-15 — End: 2024-04-21

## 2022-05-15 NOTE — Patient Instructions (Signed)

## 2022-09-10 IMAGING — MG DIGITAL DIAGNOSTIC BILAT W/ TOMO W/ CAD
8 series · 8 of 24 positions shown · non-contrast
Comparison: Previous exam(s).

CLINICAL DATA: Two year follow-up for probably benign asymmetry in
the RIGHT breast.

EXAM:
DIGITAL DIAGNOSTIC BILATERAL MAMMOGRAM WITH TOMOSYNTHESIS AND CAD
TECHNIQUE: Bilateral digital diagnostic mammography and breast tomosynthesis
was performed. The images were evaluated with computer-aided
detection.

[L MLO synth-2D]
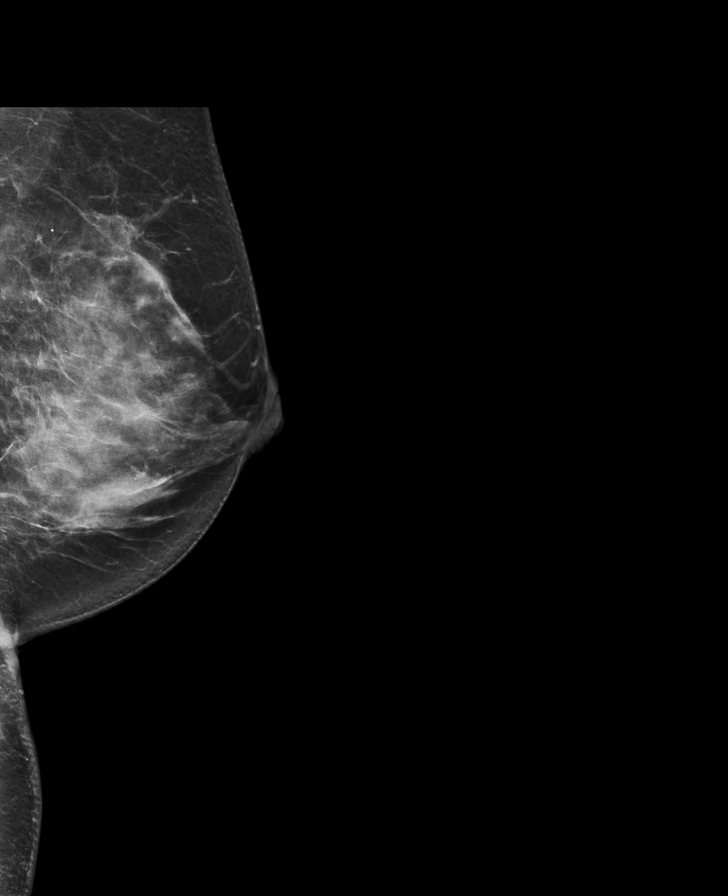

[R MLO synth-2D]
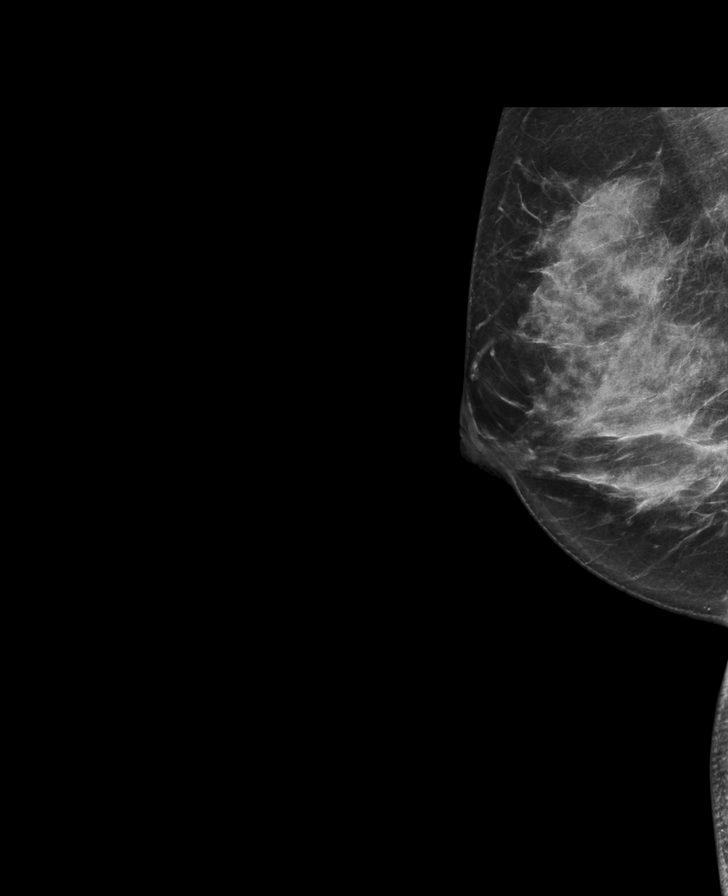

[R CC synth-2D]
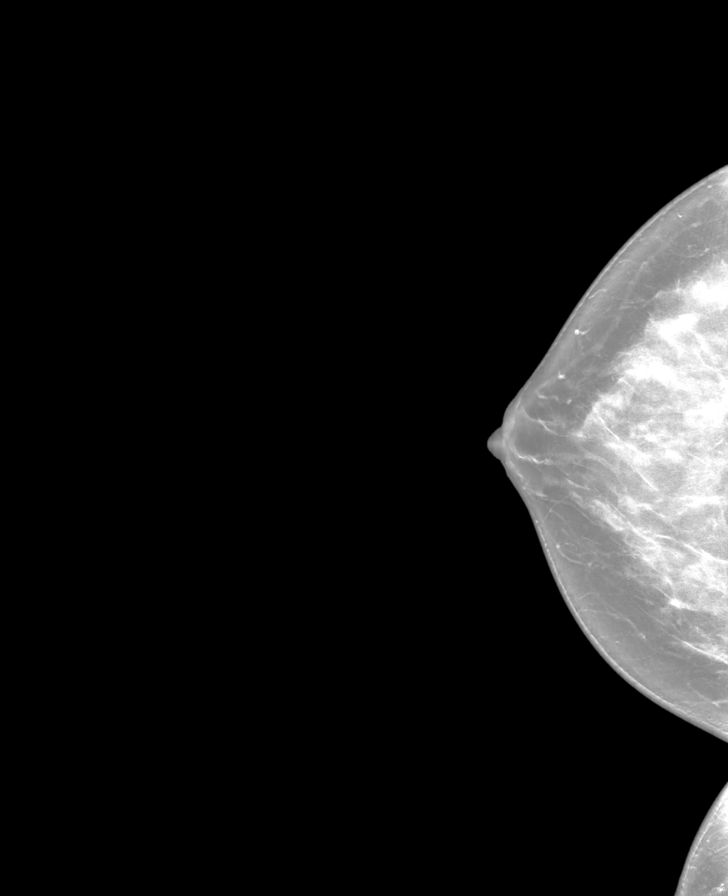

[L CC synth-2D]
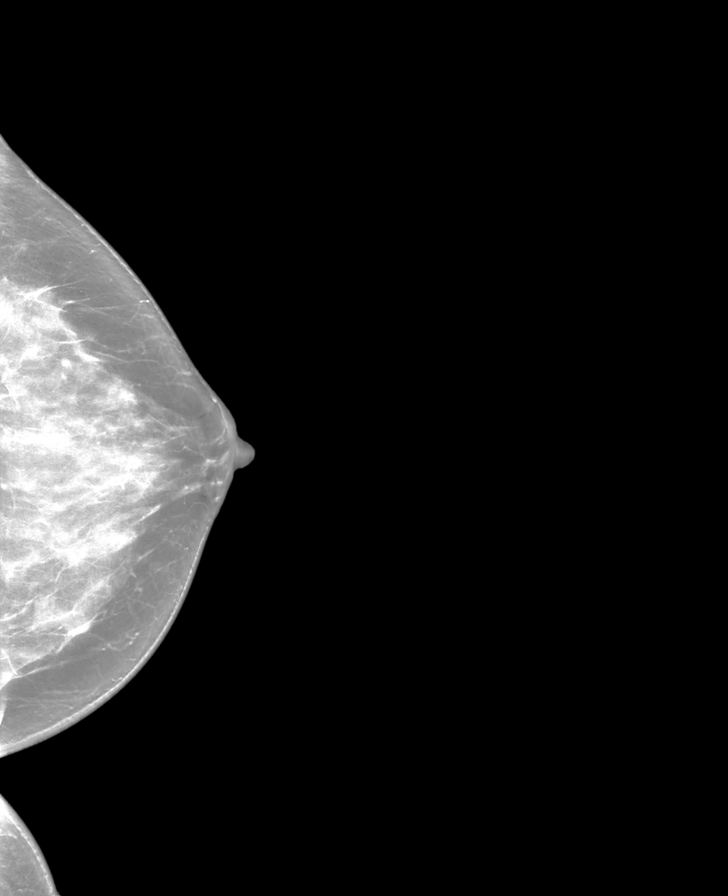

[L MLO tomo · tomo slice 40/79.0]
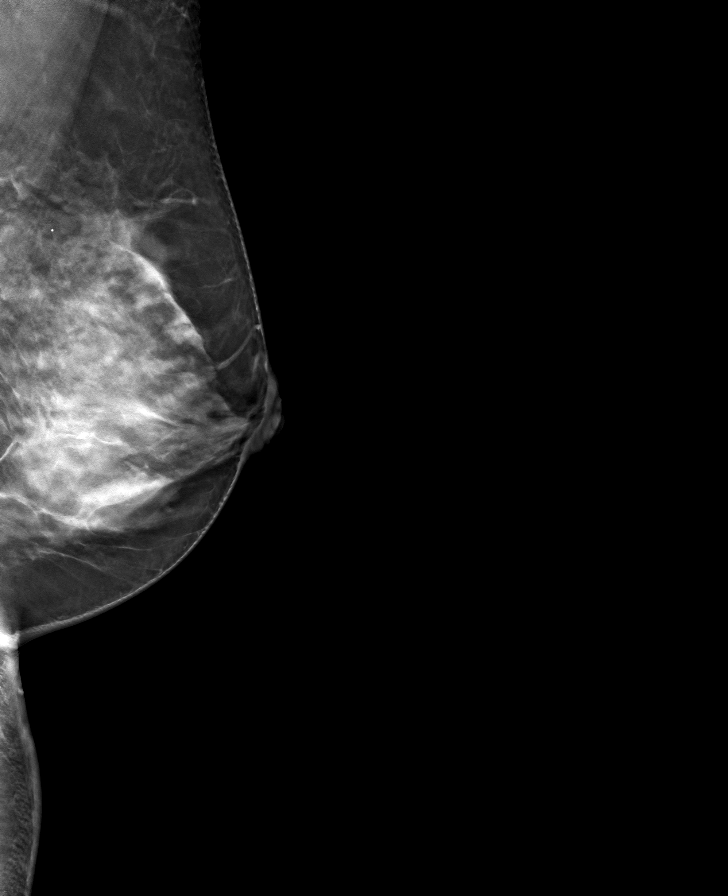

[R CC tomo · tomo slice 41/81.0]
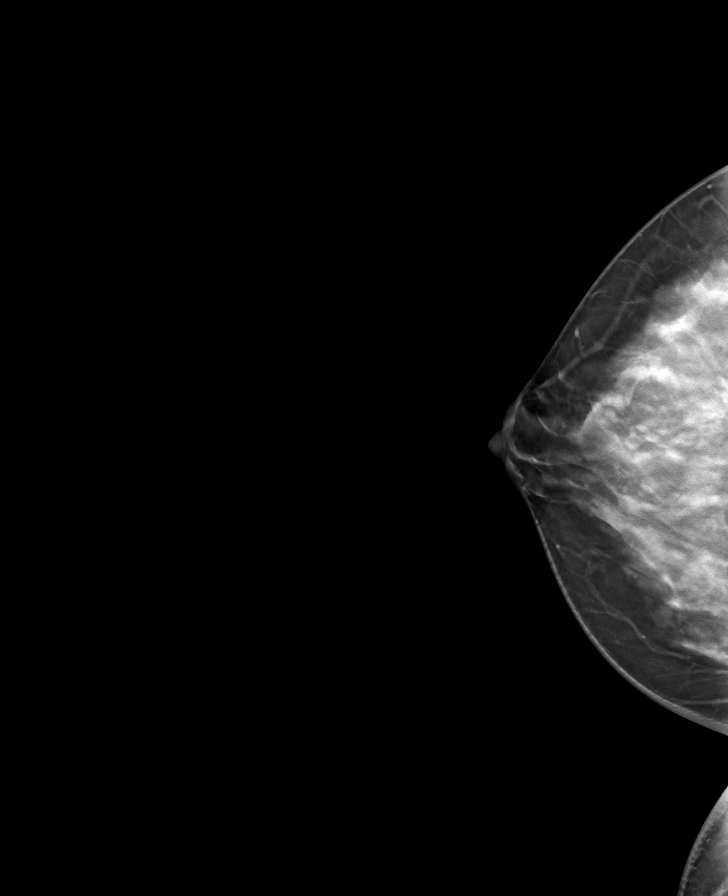

[R MLO tomo · tomo slice 40/79.0]
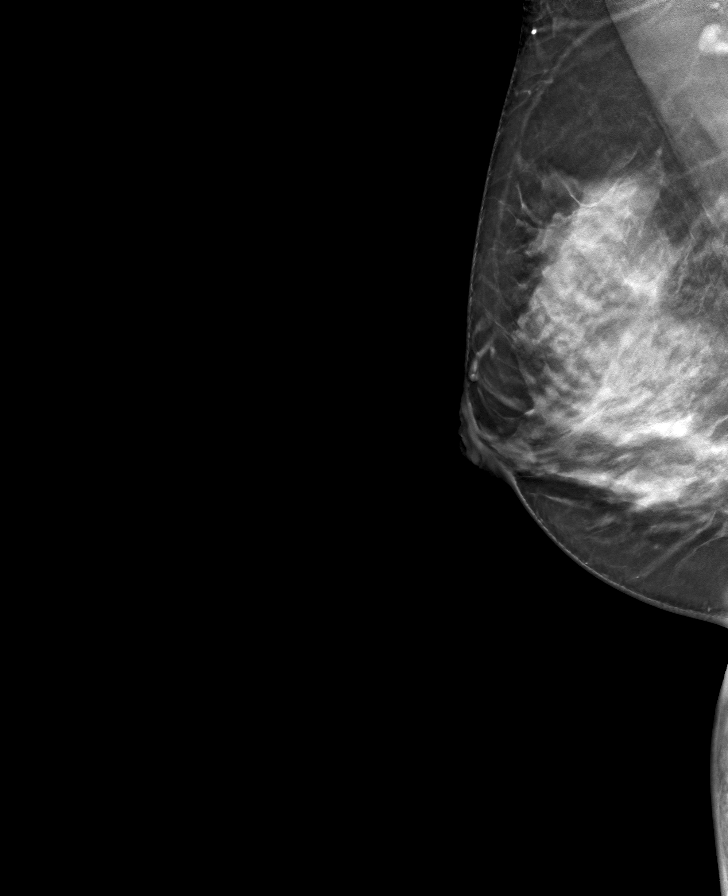

[L CC tomo · tomo slice 39/77.0]
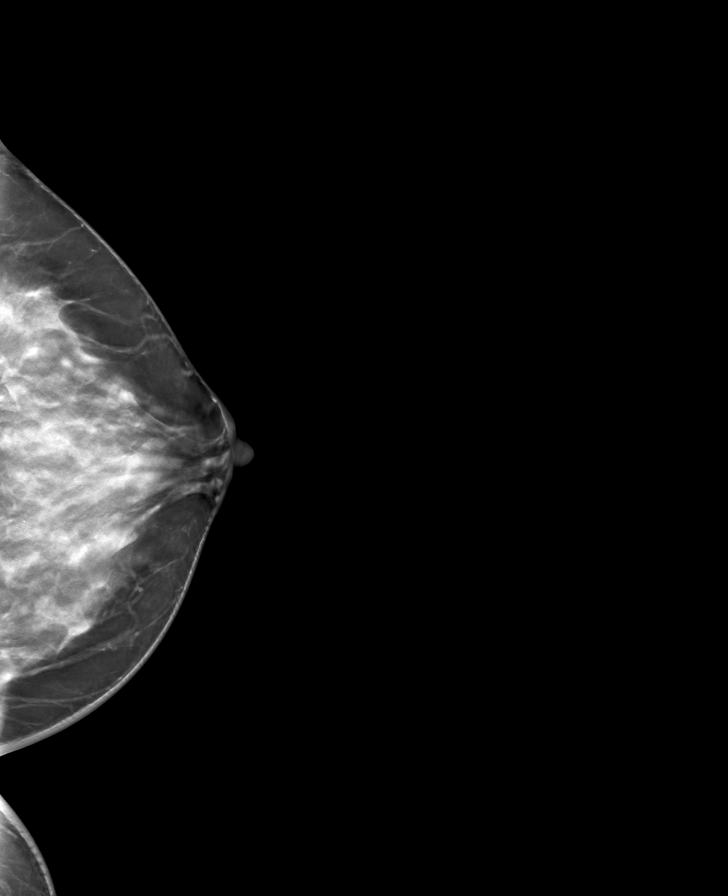

[8 of 24 positions shown; findings below may reference images not displayed]

ACR Breast Density Category c: The breast tissue is heterogeneously
dense, which may obscure small masses.
FINDINGS: No suspicious mass, distortion, or microcalcifications are
identified to suggest presence of malignancy. Asymmetry previously
noted in the superior portion of the RIGHT breast appears stable.
Previously, no sonographic correlate was identified. LEFT breast is
negative.
IMPRESSION: Long-term stability of asymmetry in the RIGHT breast, consistent
with benign fibroglandular tissue.

No mammographic evidence for malignancy.

RECOMMENDATION:
Screening mammogram in one year.(Code:TD-S-VOF)

I have discussed the findings and recommendations with the patient.
If applicable, a reminder letter will be sent to the patient
regarding the next appointment.

BI-RADS CATEGORY  2: Benign.

## 2022-09-27 ENCOUNTER — Encounter: Payer: Self-pay | Admitting: Obstetrics and Gynecology

## 2022-09-28 ENCOUNTER — Other Ambulatory Visit: Payer: Self-pay | Admitting: Obstetrics and Gynecology

## 2022-12-21 ENCOUNTER — Other Ambulatory Visit: Payer: Self-pay | Admitting: Family Medicine

## 2022-12-21 DIAGNOSIS — Z1231 Encounter for screening mammogram for malignant neoplasm of breast: Secondary | ICD-10-CM

## 2023-02-04 ENCOUNTER — Ambulatory Visit: Payer: 59

## 2023-02-22 ENCOUNTER — Ambulatory Visit: Payer: 59 | Admitting: Obstetrics & Gynecology

## 2023-02-26 ENCOUNTER — Encounter: Payer: Self-pay | Admitting: Obstetrics and Gynecology

## 2023-02-26 ENCOUNTER — Ambulatory Visit: Payer: 59 | Admitting: Obstetrics and Gynecology

## 2023-02-26 VITALS — BP 136/80 | Ht 63.0 in | Wt 143.0 lb

## 2023-02-26 DIAGNOSIS — N951 Menopausal and female climacteric states: Secondary | ICD-10-CM | POA: Diagnosis not present

## 2023-02-26 DIAGNOSIS — R5383 Other fatigue: Secondary | ICD-10-CM | POA: Diagnosis not present

## 2023-02-26 DIAGNOSIS — R300 Dysuria: Secondary | ICD-10-CM

## 2023-02-26 DIAGNOSIS — R102 Pelvic and perineal pain: Secondary | ICD-10-CM

## 2023-02-26 DIAGNOSIS — R6882 Decreased libido: Secondary | ICD-10-CM | POA: Diagnosis not present

## 2023-02-26 LAB — WET PREP FOR TRICH, YEAST, CLUE

## 2023-02-26 LAB — CULTURE INDICATED

## 2023-02-26 MED ORDER — METRONIDAZOLE 0.75 % VA GEL: 1.0000 | Freq: Every day | VAGINAL | 0 refills | Status: DC

## 2023-02-26 MED ORDER — ESTRADIOL 0.1 MG/GM VA CREA
TOPICAL_CREAM | VAGINAL | 1 refills | Status: DC
Start: 2023-02-26 — End: 2024-04-21

## 2023-02-26 NOTE — Progress Notes (Signed)
GYNECOLOGY  VISIT   HPI: 47 y.o.   Married  [place]  female   [phone] with Patient's last menstrual period was 02/12/2023.   here for   vaginal discomfort. Pt noticed discomfort after her period or intercourse. She notices pain when sitting, and feels pain on one side of the vagina. Pt reported feeling like she had infection about 5 days ago and treated with cranberry.  Some discharge and no odor.   Vaginal pain when she has her menstrual cycle. Menstruation lasts 2 days and is very light.   Gaining weight. Going to the gym.   Tired.   FH of thyroid disease in her mother.   Taking HRT.  Hot flashes are controlled.   Decreased libido.  She stopped her testosterone.   She wants general hormonal testing.   Dealing with constipation.  Using Linzess.   Currently dealing with a respiratory infection and cough.   GYNECOLOGIC HISTORY: Patient's last menstrual period was 02/12/2023. Contraception:  perimenopause Menopausal hormone therapy:  progesterone and estradiol patch Last mammogram:  01/17/22 Breast Density Cat C, BI-RADS CAT 2 benign Last pap smear:   05/12/21 neg, 08/18/19 neg:HR HPV neg        OB History     Gravida  1   Para  1   Term  1   Preterm      AB      Living  1      SAB      IAB      Ectopic      Multiple      Live Births                 Patient Active Problem List   Diagnosis Date Noted   Hypercalcemia 08/03/2021   Genetic testing 09/11/2018   Family history of breast cancer    Family history of colon cancer    Family history of uterine cancer    Family history of pancreatic cancer    Essential hypertension 05/16/2018   Abnormal uterine bleeding (AUB) 05/16/2018    Past Medical History:  Diagnosis Date   COVID 02/2021   Elevated ALT measurement 2020   Essential hypertension    Family history of breast cancer    Family history of colon cancer    Family history of pancreatic cancer    Family history of uterine cancer     Glaucoma    Hyperlipidemia    Skin cancer     Past Surgical History:  Procedure Laterality Date   CESAREAN SECTION  1997   CHOLECYSTECTOMY     DG CHOLECYSTOGRAPHY GALL BLADDER ([facility] HX)  2017   HEMORRHOID SURGERY      Current Outpatient Medications  Medication Sig Dispense Refill   estradiol (VIVELLE-DOT) 0.1 MG/24HR patch Place 1 patch (0.1 mg total) onto the skin 2 (two) times a week. 24 patch 3   LINZESS 72 MCG capsule Take 72 mcg by mouth daily.     nystatin-triamcinolone ointment (MYCOLOG) Apply 1 Application topically 2 (two) times daily. 30 g 0   progesterone (PROMETRIUM) 100 MG capsule Take 1 capsule (100 mg total) by mouth daily. Take at night before bed. 90 capsule 3   telmisartan-hydrochlorothiazide (MICARDIS HCT) 40-12.5 MG tablet Take 1 tablet by mouth daily.     timolol (TIMOPTIC) 0.5 % ophthalmic solution 1 drop 2 (two) times daily.     venlafaxine (EFFEXOR) 37.5 MG tablet Take by mouth.     No current facility-administered medications for  this visit.     ALLERGIES: Dipyrone, Egg-derived products, Iodine, and Latex  Family History  Problem Relation Age of Onset   Hypertension Mother    High Cholesterol Mother    Heart attack Father    Hypertension Father    High Cholesterol Father    Kidney disease Father    Stroke Father    Diabetes Father    Heart disease Father    High blood pressure Sister    High blood pressure Sister    Ovarian cancer Maternal Grandmother 56       ut and ov cancer dx at the same time in erh 69's,   Uterine cancer Maternal Grandmother 65   Pancreatic cancer Maternal Grandmother 64       died 1 month after   Heart attack Maternal Grandfather    Pancreatic cancer Paternal Grandmother    Heart attack Paternal Grandfather    Diabetes Daughter    Depression Daughter    Breast cancer Maternal Aunt 58   Colon cancer Maternal Uncle 45   Hypercalcemia Neg Hx     Social History   Socioeconomic History   Marital status: Married     Spouse name: Not on file   Number of children: Not on file   Years of education: Not on file   Highest education level: Not on file  Occupational History   Not on file  Tobacco Use   Smoking status: Never   Smokeless tobacco: Never  Substance and Sexual Activity   Alcohol use: Never   Drug use: Never   Sexual activity: Yes    Birth control/protection: None  Other Topics Concern   Not on file  Social History Narrative   Not on file   Social Determinants of Health   Financial Resource Strain: Not on file  Food Insecurity: Not on file  Transportation Needs: Not on file  Physical Activity: Not on file  Stress: Not on file  Social Connections: Not on file  Intimate Partner Violence: Not on file    Review of Systems  Genitourinary:  Positive for dysuria and vaginal pain.  All other systems reviewed and are negative.   PHYSICAL EXAMINATION:    BP 136/80 (BP Location: Left Arm, Patient Position: Sitting, Cuff Size: Normal)   Ht 5\' 3"  (1.6 m)   Wt 143 lb (64.9 kg)   LMP 02/12/2023   BMI 25.33 kg/m     General appearance: alert, cooperative and appears stated age  Pelvic: External genitalia:  no lesions              Urethra:  normal appearing urethra with no masses, tenderness or lesions              Bartholins and Skenes: normal                 Vagina: normal appearing vagina with normal color and discharge, no lesions              Cervix: no lesions                Bimanual Exam:  Uterus:  normal size, contour, position, consistency, mobility, non-tender              Adnexa: no mass, fullness, tenderness             Chaperone was present for exam:  Warren Lacy, CMA  ASSESSMENT  Vaginal pain.  Dysuria. Menopausal symptoms.  On HRT.  Decreased libido.  Fatigue.  PLAN  Urinalysis:  sg 1.025, pH 5.5, 0 - 5 WBC, NS RBC, 6 - 10 Epis, mod bacteria, clue cells  present.  UC sent.   Will await UC prior to treating any potential bladder infection.  Wet prep:  Clue  cells, neg yeast, neg trichomonas. Metrogel pv at hs x 5 nights.  CBC, A1C, TSH, free T3, free T4, testosterone. Start estrogen cream for the vagina after treating bacterial vaginosis.  Consider pelvic US if vaginal pain persists after treating BV and vaginal atrophy.  Fu for annual exam in July and prn.    35 min  total time was spent for this patient encounter, including preparation, face-to-face counseling with the patient, coordination of care, and documentation of the encounter.

## 2023-02-27 ENCOUNTER — Other Ambulatory Visit: Payer: 59

## 2023-02-27 ENCOUNTER — Other Ambulatory Visit: Payer: Self-pay | Admitting: *Deleted

## 2023-02-27 DIAGNOSIS — R5383 Other fatigue: Secondary | ICD-10-CM

## 2023-02-27 DIAGNOSIS — R6882 Decreased libido: Secondary | ICD-10-CM

## 2023-02-27 LAB — CBC
MPV: 10.9 fL (ref 7.5–12.5)
RBC: 4.67 10*6/uL (ref 3.80–5.10)

## 2023-02-28 LAB — CBC
HCT: 43 % (ref 35.0–45.0)
Platelets: 306 10*3/uL (ref 140–400)
RDW: 12.2 % (ref 11.0–15.0)

## 2023-02-28 LAB — URINALYSIS, COMPLETE W/RFL CULTURE
Bilirubin Urine: NEGATIVE
Glucose, UA: NEGATIVE
Hgb urine dipstick: NEGATIVE
Hyaline Cast: NONE SEEN /LPF
Ketones, ur: NEGATIVE
Nitrites, Initial: NEGATIVE
Protein, ur: NEGATIVE
RBC / HPF: NONE SEEN /HPF (ref 0–2)
Specific Gravity, Urine: 1.025 (ref 1.001–1.035)
pH: 5.5 (ref 5.0–8.0)

## 2023-02-28 LAB — T4, FREE: Free T4: 1.2 ng/dL (ref 0.8–1.8)

## 2023-02-28 LAB — URINE CULTURE
MICRO NUMBER:: 14892565
Result:: NO GROWTH
SPECIMEN QUALITY:: ADEQUATE

## 2023-02-28 LAB — TSH: TSH: 1.19 mIU/L

## 2023-02-28 LAB — T3, FREE: T3, Free: 3.2 pg/mL (ref 2.3–4.2)

## 2023-03-03 LAB — HEMOGLOBIN A1C
Hgb A1c MFr Bld: 5.5 % of total Hgb (ref <5.7)
Mean Plasma Glucose: 111 mg/dL
eAG (mmol/L): 6.2 mmol/L

## 2023-03-03 LAB — CBC
Hemoglobin: 14.3 g/dL (ref 11.7–15.5)
MCH: 30.6 pg (ref 27.0–33.0)
MCHC: 33.3 g/dL (ref 32.0–36.0)
MCV: 92.1 fL (ref 80.0–100.0)
WBC: 7.9 10*3/uL (ref 3.8–10.8)

## 2023-03-03 LAB — TESTOS,TOTAL,FREE AND SHBG (FEMALE)
Free Testosterone: 0.6 pg/mL (ref 0.1–6.4)
Sex Hormone Binding: 33.6 nmol/L (ref 17–124)
Testosterone, Total, LC-MS-MS: 4 ng/dL (ref 2–45)

## 2023-03-07 ENCOUNTER — Encounter: Payer: Self-pay | Admitting: Obstetrics and Gynecology

## 2023-03-07 DIAGNOSIS — R6882 Decreased libido: Secondary | ICD-10-CM

## 2023-03-08 MED ORDER — NONFORMULARY OR COMPOUNDED ITEM
1 refills | Status: DC
Start: 2023-03-08 — End: 2023-04-03

## 2023-03-08 NOTE — Telephone Encounter (Signed)
Per BS:   "Rx for testosterone cream 1%. Apply 0.5 grams to skin of thigh or lower abdomen daily.  Rotate site. Disp:  30 grams.   RF:  one  Please make an appointment with me for a recheck and blood work in 6 weeks.  Thank you."  Rx phoned in to [facility].

## 2023-03-08 NOTE — Telephone Encounter (Signed)
Please forward me the script info and I will call in. Thanks.

## 2023-03-11 NOTE — Telephone Encounter (Signed)
Pt scheduled for recheck on 04/01/2023. Will route to provider for final review and close encounter.

## 2023-03-18 NOTE — Progress Notes (Deleted)
GYNECOLOGY  VISIT   HPI: 47 y.o.   Married  [place]  female   G1P1001 with No LMP recorded. Patient is perimenopausal.   here for   6 week med recheck  GYNECOLOGIC HISTORY: No LMP recorded. Patient is perimenopausal. Contraception:  perimenopause Menopausal hormone therapy:  progesterone and estradiol patch Last mammogram:   01/17/22 Breast Density Cat C, BI-RADS CAT 2 benign  Last pap smear:   05/12/21 neg, 08/18/19 neg:HR HPV neg         OB History     Gravida  1   Para  1   Term  1   Preterm      AB      Living  1      SAB      IAB      Ectopic      Multiple      Live Births                 Patient Active Problem List   Diagnosis Date Noted   Hypercalcemia 08/03/2021   Genetic testing 09/11/2018   Family history of breast cancer    Family history of colon cancer    Family history of uterine cancer    Family history of pancreatic cancer    Essential hypertension 05/16/2018   Abnormal uterine bleeding (AUB) 05/16/2018    Past Medical History:  Diagnosis Date   COVID 02/2021   Elevated ALT measurement 2020   Essential hypertension    Family history of breast cancer    Family history of colon cancer    Family history of pancreatic cancer    Family history of uterine cancer    Glaucoma    Hyperlipidemia    Skin cancer     Past Surgical History:  Procedure Laterality Date   CESAREAN SECTION  1997   CHOLECYSTECTOMY     DG CHOLECYSTOGRAPHY GALL BLADDER ([facility] HX)  2017   HEMORRHOID SURGERY      Current Outpatient Medications  Medication Sig Dispense Refill   estradiol (ESTRACE) 0.1 MG/GM vaginal cream Use 1/2 g vaginally every night for the first 2 weeks, then use 1/2 g vaginally two or three times per week as needed to maintain symptom relief. 42.5 g 1   estradiol (VIVELLE-DOT) 0.1 MG/24HR patch Place 1 patch (0.1 mg total) onto the skin 2 (two) times a week. 24 patch 3   LINZESS 72 MCG capsule Take 72 mcg by mouth daily.      metroNIDAZOLE (METROGEL) 0.75 % vaginal gel Place 1 Applicatorful vaginally at bedtime. Use for 5 nights. 70 g 0   NONFORMULARY OR COMPOUNDED ITEM Testosterone 1% Cream Apply 0.5gms to skin of thigh or lower abdomen daily. Rotate site. #30gms w/ 1 RF 30 each 1   nystatin-triamcinolone ointment (MYCOLOG) Apply 1 Application topically 2 (two) times daily. 30 g 0   progesterone (PROMETRIUM) 100 MG capsule Take 1 capsule (100 mg total) by mouth daily. Take at night before bed. 90 capsule 3   telmisartan-hydrochlorothiazide (MICARDIS HCT) 40-12.5 MG tablet Take 1 tablet by mouth daily.     timolol (TIMOPTIC) 0.5 % ophthalmic solution 1 drop 2 (two) times daily.     venlafaxine (EFFEXOR) 37.5 MG tablet Take by mouth.     No current facility-administered medications for this visit.     ALLERGIES: Dipyrone, Egg-derived products, Iodine, and Latex  Family History  Problem Relation Age of Onset   Hypertension Mother    High Cholesterol Mother  Heart attack Father    Hypertension Father    High Cholesterol Father    Kidney disease Father    Stroke Father    Diabetes Father    Heart disease Father    High blood pressure Sister    High blood pressure Sister    Ovarian cancer Maternal Grandmother 75       ut and ov cancer dx at the same time in erh 11's,   Uterine cancer Maternal Grandmother 64   Pancreatic cancer Maternal Grandmother 57       died 1 month after   Heart attack Maternal Grandfather    Pancreatic cancer Paternal Grandmother    Heart attack Paternal Grandfather    Diabetes Daughter    Depression Daughter    Breast cancer Maternal Aunt 60   Colon cancer Maternal Uncle 45   Hypercalcemia Neg Hx     Social History   Socioeconomic History   Marital status: Married    Spouse name: Not on file   Number of children: Not on file   Years of education: Not on file   Highest education level: Not on file  Occupational History   Not on file  Tobacco Use   Smoking status:  Never   Smokeless tobacco: Never  Substance and Sexual Activity   Alcohol use: Never   Drug use: Never   Sexual activity: Yes    Birth control/protection: None  Other Topics Concern   Not on file  Social History Narrative   Not on file   Social Determinants of Health   Financial Resource Strain: Not on file  Food Insecurity: Not on file  Transportation Needs: Not on file  Physical Activity: Not on file  Stress: Not on file  Social Connections: Not on file  Intimate Partner Violence: Not on file    Review of Systems  PHYSICAL EXAMINATION:    There were no vitals taken for this visit.    General appearance: alert, cooperative and appears stated age Head: Normocephalic, without obvious abnormality, atraumatic Neck: no adenopathy, supple, symmetrical, trachea midline and thyroid normal to inspection and palpation Lungs: clear to auscultation bilaterally Breasts: normal appearance, no masses or tenderness, No nipple retraction or dimpling, No nipple discharge or bleeding, No axillary or supraclavicular adenopathy Heart: regular rate and rhythm Abdomen: soft, non-tender, no masses,  no organomegaly Extremities: extremities normal, atraumatic, no cyanosis or edema Skin: Skin color, texture, turgor normal. No rashes or lesions Lymph nodes: Cervical, supraclavicular, and axillary nodes normal. No abnormal inguinal nodes palpated Neurologic: Grossly normal  Pelvic: External genitalia:  no lesions              Urethra:  normal appearing urethra with no masses, tenderness or lesions              Bartholins and Skenes: normal                 Vagina: normal appearing vagina with normal color and discharge, no lesions              Cervix: no lesions                Bimanual Exam:  Uterus:  normal size, contour, position, consistency, mobility, non-tender              Adnexa: no mass, fullness, tenderness              Rectal exam: {yes no:314532}.  Confirms.  Anus:   normal sphincter tone, no lesions  Chaperone was present for exam:  ***  ASSESSMENT     PLAN     An After Visit Summary was printed and given to the patient.  ______ minutes face to face time of which over 50% was spent in counseling.

## 2023-03-19 ENCOUNTER — Ambulatory Visit
Admission: RE | Admit: 2023-03-19 | Discharge: 2023-03-19 | Disposition: A | Discharge status: Home / Self Care | Payer: 59 | Source: Ambulatory Visit | Attending: Family Medicine | Admitting: Family Medicine

## 2023-03-19 DIAGNOSIS — Z1231 Encounter for screening mammogram for malignant neoplasm of breast: Secondary | ICD-10-CM

## 2023-04-01 ENCOUNTER — Ambulatory Visit: Payer: 59 | Admitting: Obstetrics and Gynecology

## 2023-04-02 NOTE — Progress Notes (Unsigned)
GYNECOLOGY  VISIT   HPI: 47 y.o.   Married  [place]  female   [phone] with Patient's last menstrual period was 03/27/2023.   here for   6 week med check. Pt did have labial swelling and has noticed more frequently. Pt has had to ice it at night and has not been able to have intercourse due to pain. Pain started this last weekend. This is a repetitive occurrence. Notes left labia large than right.  Thinks that pad is causing irritation.    Dx of BV at visit on 02/26/23 and was treated with Metrogel.   She received an Rx for vaginal estrogen cream.  Not using much yet due to use of Metrogel and recent menstrual period.   She was started on testosterone cream about 2 - 3 weeks ago after checking her levels, which were normal. Using it mostly daily.  Desire is increased.  Asking about other methods for giving testosterone.   Recent GI upset with nausea and vomiting for patient and her husband. Suspects possible food poisoning.   Traveling to [place] in July.   GYNECOLOGIC HISTORY: Patient's last menstrual period was 03/27/2023. Contraception:  perimenopause Menopausal hormone therapy:  progesterone and estradiol patch Last mammogram:   01/17/22 Breast Density Cat C, BI-RADS CAT 2 benign  Last pap smear:    05/12/21 neg, 08/18/19 neg:HR HPV neg         OB History     Gravida  1   Para  1   Term  1   Preterm      AB      Living  1      SAB      IAB      Ectopic      Multiple      Live Births                 Patient Active Problem List   Diagnosis Date Noted   Hypercalcemia 08/03/2021   Genetic testing 09/11/2018   Family history of breast cancer    Family history of colon cancer    Family history of uterine cancer    Family history of pancreatic cancer    Essential hypertension 05/16/2018   Abnormal uterine bleeding (AUB) 05/16/2018    Past Medical History:  Diagnosis Date   COVID 02/2021   Elevated ALT measurement 2020   Essential hypertension     Family history of breast cancer    Family history of colon cancer    Family history of pancreatic cancer    Family history of uterine cancer    Glaucoma    Hyperlipidemia    Skin cancer     Past Surgical History:  Procedure Laterality Date   CESAREAN SECTION  1997   CHOLECYSTECTOMY     DG CHOLECYSTOGRAPHY GALL BLADDER ([facility] HX)  2017   HEMORRHOID SURGERY      Current Outpatient Medications  Medication Sig Dispense Refill   estradiol (ESTRACE) 0.1 MG/GM vaginal cream Use 1/2 g vaginally every night for the first 2 weeks, then use 1/2 g vaginally two or three times per week as needed to maintain symptom relief. 42.5 g 1   estradiol (VIVELLE-DOT) 0.1 MG/24HR patch Place 1 patch (0.1 mg total) onto the skin 2 (two) times a week. 24 patch 3   LINZESS 72 MCG capsule Take 72 mcg by mouth daily.     metroNIDAZOLE (METROGEL) 0.75 % vaginal gel Place 1 Applicatorful vaginally at bedtime. Use for 5  nights. 70 g 0   NONFORMULARY OR COMPOUNDED ITEM Testosterone 1% Cream Apply 0.5gms to skin of thigh or lower abdomen daily. Rotate site. #30gms w/ 1 RF 30 each 1   nystatin-triamcinolone ointment (MYCOLOG) Apply 1 Application topically 2 (two) times daily. 30 g 0   progesterone (PROMETRIUM) 100 MG capsule Take 1 capsule (100 mg total) by mouth daily. Take at night before bed. 90 capsule 3   telmisartan-hydrochlorothiazide (MICARDIS HCT) 40-12.5 MG tablet Take 1 tablet by mouth daily.     timolol (TIMOPTIC) 0.5 % ophthalmic solution 1 drop 2 (two) times daily.     venlafaxine (EFFEXOR) 37.5 MG tablet Take by mouth.     No current facility-administered medications for this visit.     ALLERGIES: Dipyrone, Egg-derived products, Iodine, and Latex  Family History  Problem Relation Age of Onset   Hypertension Mother    High Cholesterol Mother    Heart attack Father    Hypertension Father    High Cholesterol Father    Kidney disease Father    Stroke Father    Diabetes Father    Heart disease  Father    High blood pressure Sister    High blood pressure Sister    Ovarian cancer Maternal Grandmother 60       ut and ov cancer dx at the same time in erh 60's,   Uterine cancer Maternal Grandmother 60   Pancreatic cancer Maternal Grandmother 57       died 1 month after   Heart attack Maternal Grandfather    Pancreatic cancer Paternal Grandmother    Heart attack Paternal Grandfather    Diabetes Daughter    Depression Daughter    Breast cancer Maternal Aunt 25   Colon cancer Maternal Uncle 45   Hypercalcemia Neg Hx     Social History   Socioeconomic History   Marital status: Married    Spouse name: Not on file   Number of children: Not on file   Years of education: Not on file   Highest education level: Not on file  Occupational History   Not on file  Tobacco Use   Smoking status: Never   Smokeless tobacco: Never  Substance and Sexual Activity   Alcohol use: Never   Drug use: Never   Sexual activity: Yes    Birth control/protection: None  Other Topics Concern   Not on file  Social History Narrative   Not on file   Social Determinants of Health   Financial Resource Strain: Not on file  Food Insecurity: Not on file  Transportation Needs: Not on file  Physical Activity: Not on file  Stress: Not on file  Social Connections: Not on file  Intimate Partner Violence: Not on file    Review of Systems  All other systems reviewed and are negative.   PHYSICAL EXAMINATION:    BP 116/72 (BP Location: Left Arm, Patient Position: Sitting, Cuff Size: Normal)   Pulse 91   Ht 5\' 3"  (1.6 m)   Wt 143 lb (64.9 kg)   LMP 03/27/2023   SpO2 98%   BMI 25.33 kg/m     General appearance: alert, cooperative and appears stated age   Pelvic: External genitalia:  left labia minora larger than right labia minora.  Small 2 mm ulcerated area of the left labia majora, painful to the touch.               Urethra:  normal appearing urethra with no masses, tenderness or  lesions               Bartholins and Skenes: normal                 Vagina: normal appearing vagina with normal color and discharge, no lesions              Cervix: no lesions                Bimanual Exam:  Uterus:  normal size, contour, position, consistency, mobility, non-tender              Adnexa: no mass, fullness, tenderness               Chaperone was present for exam:  Warren Lacy, CMA  ASSESSMENT  Vulvitis.  HRT monitoring.  Decreased libido.  Improving with testosterone therapy.    PLAN  Wet prep:  negative clue cells, yeast, and trichomonas. We discussed potential caused for painful vulvar ulcer including HSV I and II. HSV I and II PCR testing collected.  Will check serum HSV I and II IgG also.  Rx for lidocaine ointment 5% to vulva tid prn.   Rx for Valtrex 500 mg po bid x 3 days prn outbreak.   #30, RF one. Refill of testosterone use daily x 2 months.  We discussed potential implanted testosterone pellets through hormone clinics. Annual exam and testosterone check in August.   35 min  total time was spent for this patient encounter, including preparation, face-to-face counseling with the patient, coordination of care, and documentation of the encounter.

## 2023-04-03 ENCOUNTER — Encounter: Payer: Self-pay | Admitting: Obstetrics and Gynecology

## 2023-04-03 ENCOUNTER — Ambulatory Visit: Payer: 59 | Admitting: Obstetrics and Gynecology

## 2023-04-03 VITALS — BP 116/72 | HR 91 | Ht 63.0 in | Wt 143.0 lb

## 2023-04-03 DIAGNOSIS — N766 Ulceration of vulva: Secondary | ICD-10-CM

## 2023-04-03 DIAGNOSIS — Z5181 Encounter for therapeutic drug level monitoring: Secondary | ICD-10-CM | POA: Diagnosis not present

## 2023-04-03 DIAGNOSIS — R6882 Decreased libido: Secondary | ICD-10-CM | POA: Diagnosis not present

## 2023-04-03 DIAGNOSIS — N762 Acute vulvitis: Secondary | ICD-10-CM | POA: Diagnosis not present

## 2023-04-03 LAB — WET PREP FOR TRICH, YEAST, CLUE

## 2023-04-03 MED ORDER — VALACYCLOVIR HCL 500 MG PO TABS: 500.0000 mg | ORAL_TABLET | Freq: Two times a day (BID) | ORAL | 1 refills | Status: AC

## 2023-04-03 MED ORDER — LIDOCAINE 5 % EX OINT
1.0000 | TOPICAL_OINTMENT | Freq: Three times a day (TID) | CUTANEOUS | 1 refills | Status: DC
Start: 2023-04-03 — End: 2024-04-21

## 2023-04-03 MED ORDER — NONFORMULARY OR COMPOUNDED ITEM: 1 refills | Status: DC

## 2023-04-04 LAB — HSV(HERPES SIMPLEX VRS) I + II AB-IGG
HAV 1 IGG,TYPE SPECIFIC AB: 21.9 index — ABNORMAL HIGH
HSV 2 IGG,TYPE SPECIFIC AB: 7.35 index — ABNORMAL HIGH

## 2023-04-04 LAB — SURESWAB HSV, TYPE 1/2 DNA, PCR
HSV 1 DNA: NOT DETECTED
HSV 2 DNA: DETECTED — AB

## 2023-04-05 ENCOUNTER — Encounter: Payer: Self-pay | Admitting: Obstetrics and Gynecology

## 2023-04-10 ENCOUNTER — Other Ambulatory Visit: Payer: Self-pay | Admitting: Obstetrics and Gynecology

## 2023-04-23 ENCOUNTER — Other Ambulatory Visit: Payer: Self-pay | Admitting: Obstetrics and Gynecology

## 2023-04-29 ENCOUNTER — Ambulatory Visit: Payer: 59 | Admitting: Obstetrics and Gynecology

## 2023-05-03 DIAGNOSIS — F4321 Adjustment disorder with depressed mood: Secondary | ICD-10-CM | POA: Insufficient documentation

## 2023-05-29 NOTE — Progress Notes (Signed)
GYNECOLOGY  VISIT   HPI: 47 y.o.   Married  Hispanic  female   G1P1001 with Patient's last menstrual period was 06/09/2023.   here for   pt has pain in the back and started PT next week. Pt wants to discuss short period in a shorter amount of time.  Has a new dx of arthritis in her back.  Saw orthopedic specialist.  Taking an anti-inflammatory.  She will do PT.  She is also seeing a homeopathic specialist.   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   No further outbreaks.   She is bleeding a little bit every 2 weeks.   Lasts about 2 - 3 days.  Sometimes does not change the patch on time. Sometime she does not remember to use the testosterone.   Needs refill of testosterone, estradiol patch, and Prometrium.  Annual exam scheduled for September.   GYNECOLOGIC HISTORY: Patient's last menstrual period was 06/09/2023. Contraception:  perimenopause Menopausal hormone therapy:  estradiol patch and cream Last mammogram:  03/19/23 Breast Density Cat C, BI-RADS CAT 1 neg Last pap smear:   05/12/21 neg, 08/18/19 neg: HR HPV neg        OB History     Gravida  1   Para  1   Term  1   Preterm      AB      Living  1      SAB      IAB      Ectopic      Multiple      Live Births                 Patient Active Problem List   Diagnosis Date Noted   Hypercalcemia 08/03/2021   Genetic testing 09/11/2018   Family history of breast cancer    Family history of colon cancer    Family history of uterine cancer    Family history of pancreatic cancer    Essential hypertension 05/16/2018   Abnormal uterine bleeding (AUB) 05/16/2018    Past Medical History:  Diagnosis Date   COVID 02/2021   Elevated ALT measurement 2020   Essential hypertension    Family history of breast cancer    Family history of colon cancer    Family history of pancreatic cancer    Family history of uterine cancer    Glaucoma    HSV-1 infection    HSV-2 infection     Hyperlipidemia    Skin cancer     Past Surgical History:  Procedure Laterality Date   CESAREAN SECTION  1997   CHOLECYSTECTOMY     DG CHOLECYSTOGRAPHY GALL BLADDER ([facility] HX)  2017   HEMORRHOID SURGERY      Current Outpatient Medications  Medication Sig Dispense Refill   albuterol (VENTOLIN HFA) 108 (90 Base) MCG/ACT inhaler SMARTSIG:2 Puff(s) By Mouth Every 4-6 Hours PRN     amLODipine (NORVASC) 5 MG tablet Take 1 tablet by mouth daily.     cyclobenzaprine (FLEXERIL) 10 MG tablet Take 10 mg by mouth 3 (three) times daily as needed.     diclofenac (VOLTAREN) 75 MG EC tablet Take 75 mg by mouth 2 (two) times daily as needed.     estradiol (ESTRACE) 0.1 MG/GM vaginal cream Use 1/2 g vaginally every night for the first 2 weeks, then use 1/2 g vaginally two or three times per week as needed to maintain symptom relief. 42.5 g 1   lidocaine (  XYLOCAINE) 5 % ointment Apply 1 Application topically 3 (three) times daily. Use three times daily as needed. 1.25 g 1   LINZESS 72 MCG capsule Take 72 mcg by mouth daily.     metroNIDAZOLE (METROGEL) 0.75 % vaginal gel Place 1 Applicatorful vaginally at bedtime. Use for 5 nights. 70 g 0   nystatin-triamcinolone ointment (MYCOLOG) Apply 1 Application topically 2 (two) times daily. 30 g 0   telmisartan-hydrochlorothiazide (MICARDIS HCT) 40-12.5 MG tablet Take 1 tablet by mouth daily.     timolol (TIMOPTIC) 0.5 % ophthalmic solution 1 drop 2 (two) times daily.     valACYclovir (VALTREX) 500 MG tablet Take 1 tablet (500 mg total) by mouth 2 (two) times daily. Take twice a day for 3 days as needed for an outbreak. 30 tablet 1   [START ON 06/13/2023] estradiol (VIVELLE-DOT) 0.1 MG/24HR patch Place 1 patch (0.1 mg total) onto the skin 2 (two) times a week. 24 patch 0   NONFORMULARY OR COMPOUNDED ITEM Testosterone 1% Cream Apply 0.5gms to skin of thigh or lower abdomen daily. Rotate site. #30gms w/ 1 RF 30 each 0   progesterone (PROMETRIUM) 200 MG capsule Take 1  capsule (200 mg total) by mouth daily. Take at night before bed. 90 capsule 0   venlafaxine (EFFEXOR) 37.5 MG tablet Take by mouth.     No current facility-administered medications for this visit.     ALLERGIES: Dipyrone, Egg-derived products, Iodine, and Latex  Family History  Problem Relation Age of Onset   Hypertension Mother    High Cholesterol Mother    Heart attack Father    Hypertension Father    High Cholesterol Father    Kidney disease Father    Stroke Father    Diabetes Father    Heart disease Father    High blood pressure Sister    High blood pressure Sister    Ovarian cancer Maternal Grandmother 37       ut and ov cancer dx at the same time in erh 69's,   Uterine cancer Maternal Grandmother 38   Pancreatic cancer Maternal Grandmother 16       died 1 month after   Heart attack Maternal Grandfather    Pancreatic cancer Paternal Grandmother    Heart attack Paternal Grandfather    Diabetes Daughter    Depression Daughter    Breast cancer Maternal Aunt 59   Colon cancer Maternal Uncle 45   Hypercalcemia Neg Hx     Social History   Socioeconomic History   Marital status: Married    Spouse name: Not on file   Number of children: Not on file   Years of education: Not on file   Highest education level: Not on file  Occupational History   Not on file  Tobacco Use   Smoking status: Never   Smokeless tobacco: Never  Substance and Sexual Activity   Alcohol use: Never   Drug use: Never   Sexual activity: Yes    Birth control/protection: None  Other Topics Concern   Not on file  Social History Narrative   Not on file   Social Determinants of Health   Financial Resource Strain: Not on file  Food Insecurity: Not on file  Transportation Needs: Not on file  Physical Activity: Not on file  Stress: Not on file  Social Connections: Not on file  Intimate Partner Violence: Not on file    Review of Systems  All other systems reviewed and are  negative.  PHYSICAL EXAMINATION:    BP 122/82 (BP Location: Left Arm, Patient Position: Sitting, Cuff Size: Normal)   Pulse 87   Ht 5\' 3"  (1.6 m)   Wt 143 lb (64.9 kg)   LMP 06/09/2023   SpO2 99%   BMI 25.33 kg/m     General appearance: alert, cooperative and appears stated age  ASSESSMENT  Irregular bleeding.  HRT.  Decreased libido.  Perimenopausal female.  PLAN  Will check FSH.  Order pelvic ultrasound.  Refill of Estradiol patch 0.1 mg twice weekly.  #24, RF none.  Prometrium increase to 200 mg q hs.  #24, RF none.  Refill of testosterone to send to [facility].  Keep annual exam for September.    20  total time was spent for this patient encounter, including preparation, face-to-face counseling with the patient, coordination of care, and documentation of the encounter.

## 2023-06-03 ENCOUNTER — Other Ambulatory Visit: Payer: Self-pay

## 2023-06-03 MED ORDER — ESTRADIOL 0.1 MG/24HR TD PTTW: 1.0000 | MEDICATED_PATCH | TRANSDERMAL | 0 refills | Status: DC

## 2023-06-03 NOTE — Telephone Encounter (Signed)
Med refill request: estradiol patch Last AEX: 05/15/22 Next AEX: 06/12/23 Last MMG (if hormonal med) 03/19/23 Refill authorized: Please Advise, 324, 3 RF

## 2023-06-12 ENCOUNTER — Encounter: Payer: Self-pay | Admitting: Obstetrics and Gynecology

## 2023-06-12 ENCOUNTER — Ambulatory Visit: Payer: 59 | Admitting: Obstetrics and Gynecology

## 2023-06-12 ENCOUNTER — Telehealth: Payer: Self-pay | Admitting: Obstetrics and Gynecology

## 2023-06-12 VITALS — BP 122/82 | HR 87 | Ht 63.0 in | Wt 143.0 lb

## 2023-06-12 DIAGNOSIS — R6882 Decreased libido: Secondary | ICD-10-CM | POA: Diagnosis not present

## 2023-06-12 DIAGNOSIS — N926 Irregular menstruation, unspecified: Secondary | ICD-10-CM

## 2023-06-12 DIAGNOSIS — Z79890 Hormone replacement therapy (postmenopausal): Secondary | ICD-10-CM | POA: Diagnosis not present

## 2023-06-12 MED ORDER — NONFORMULARY OR COMPOUNDED ITEM
0 refills | Status: AC
Start: 2023-06-12 — End: ?

## 2023-06-12 MED ORDER — ESTRADIOL 0.1 MG/24HR TD PTTW: 1.0000 | MEDICATED_PATCH | TRANSDERMAL | 0 refills | Status: DC

## 2023-06-12 MED ORDER — PROGESTERONE 200 MG PO CAPS: 200.0000 mg | ORAL_CAPSULE | Freq: Every day | ORAL | 0 refills | Status: DC

## 2023-06-12 NOTE — Telephone Encounter (Signed)
Please schedule pelvic ultrasound at [facility].   My patient has irregular vaginal bleeding on HRT.  She is perimenopausal.

## 2023-06-12 NOTE — Addendum Note (Signed)
Addended by: Rushie Goltz on: 06/12/2023 09:38 AM   Modules accepted: Orders

## 2023-06-12 NOTE — Addendum Note (Signed)
Addended by: Leda Min on: 06/12/2023 09:47 AM   Modules accepted: Orders

## 2023-06-14 ENCOUNTER — Encounter: Payer: Self-pay | Admitting: Obstetrics and Gynecology

## 2023-06-17 NOTE — Telephone Encounter (Signed)
Pt's [place] scheduled for 06/26/2023. Routing to provider for final review and closing encounter.

## 2023-06-25 ENCOUNTER — Other Ambulatory Visit: Payer: 59

## 2023-06-25 DIAGNOSIS — N926 Irregular menstruation, unspecified: Secondary | ICD-10-CM

## 2023-06-26 ENCOUNTER — Ambulatory Visit
Admission: RE | Admit: 2023-06-26 | Discharge: 2023-06-26 | Disposition: A | Discharge status: Home / Self Care | Payer: 59 | Source: Ambulatory Visit | Attending: Obstetrics and Gynecology | Admitting: Obstetrics and Gynecology

## 2023-06-26 DIAGNOSIS — N926 Irregular menstruation, unspecified: Secondary | ICD-10-CM

## 2023-06-26 LAB — FOLLICLE STIMULATING HORMONE: FSH: 7.5 m[IU]/mL

## 2023-07-02 ENCOUNTER — Other Ambulatory Visit: Payer: Self-pay

## 2023-07-02 NOTE — Telephone Encounter (Signed)
Med refill request: HRT/Progesterone 100mg  caps Last AEX: 05/15/2022 Next AEX: 07/23/2023 Last MMG (if hormonal med): 03/19/2023-neg birads 1; Cat C Refill authorized: rx denied.  Per request: last refill provided on 03/24/2023.  Per EMR: last refill sent on 06/12/2023 for #90 caps of 200mg ; dose increased per visit notes on 06/12/2023.  Refill request for refill of 100mg  dose denied.   Routing to provider for final review.

## 2023-07-05 ENCOUNTER — Other Ambulatory Visit: Payer: Self-pay | Admitting: Obstetrics and Gynecology

## 2023-07-05 ENCOUNTER — Encounter: Payer: Self-pay | Admitting: Obstetrics and Gynecology

## 2023-07-05 MED ORDER — MEDROXYPROGESTERONE ACETATE 10 MG PO TABS: 10.0000 mg | ORAL_TABLET | Freq: Every day | ORAL | 0 refills | Status: DC

## 2023-07-05 NOTE — Telephone Encounter (Signed)
Pelvic US performed on 06/26/2023. Report ready for review. Next appt scheduled for AEX on 07/23/2023.

## 2023-07-05 NOTE — Progress Notes (Signed)
See My Chart message.  Patient to stop her HRT and take Provera 10 mg x 10 days.  Will reassess at upcoming visit.

## 2023-07-09 NOTE — Progress Notes (Deleted)
47 y.o. G72P1001 Married [place] female here for annual exam.    PCP:     No LMP recorded. Patient is perimenopausal.           Sexually active: {yes no:314532}  The current method of family planning is perimenopause.    Exercising: {yes no:314532}  {types:19826} Smoker:  no  Health Maintenance: Pap:  05/12/21 neg, 08/18/19 neg: HR HPV neg  History of abnormal Pap:  no MMG:  03/19/23 Breast Density Cat C, BI-RADS CAT 1 neg  Colonoscopy:  06/2021 BMD:   11/19/17  Result  normal in [place] TDaP:  2018 Gardasil:   no HIV: neg in past Hep C: neg in past Screening Labs:  Hb today: ***, Urine today: ***   reports that she has never smoked. She has never used smokeless tobacco. She reports that she does not drink alcohol and does not use drugs.  Past Medical History:  Diagnosis Date   COVID 02/2021   Elevated ALT measurement 2020   Essential hypertension    Family history of breast cancer    Family history of colon cancer    Family history of pancreatic cancer    Family history of uterine cancer    Glaucoma    HSV-1 infection    HSV-2 infection    Hyperlipidemia    Skin cancer     Past Surgical History:  Procedure Laterality Date   CESAREAN SECTION  1997   CHOLECYSTECTOMY     DG CHOLECYSTOGRAPHY GALL BLADDER ([facility] HX)  2017   HEMORRHOID SURGERY      Current Outpatient Medications  Medication Sig Dispense Refill   albuterol (VENTOLIN HFA) 108 (90 Base) MCG/ACT inhaler SMARTSIG:2 Puff(s) By Mouth Every 4-6 Hours PRN     amLODipine (NORVASC) 5 MG tablet Take 1 tablet by mouth daily.     cyclobenzaprine (FLEXERIL) 10 MG tablet Take 10 mg by mouth 3 (three) times daily as needed.     diclofenac (VOLTAREN) 75 MG EC tablet Take 75 mg by mouth 2 (two) times daily as needed.     estradiol (ESTRACE) 0.1 MG/GM vaginal cream Use 1/2 g vaginally every night for the first 2 weeks, then use 1/2 g vaginally two or three times per week as needed to maintain symptom relief. 42.5 g 1    lidocaine (XYLOCAINE) 5 % ointment Apply 1 Application topically 3 (three) times daily. Use three times daily as needed. 1.25 g 1   LINZESS 72 MCG capsule Take 72 mcg by mouth daily.     medroxyPROGESTERone (PROVERA) 10 MG tablet Take 1 tablet (10 mg total) by mouth daily. 10 tablet 0   metroNIDAZOLE (METROGEL) 0.75 % vaginal gel Place 1 Applicatorful vaginally at bedtime. Use for 5 nights. 70 g 0   NONFORMULARY OR COMPOUNDED ITEM Testosterone 1% Cream Apply 0.5gms to skin of thigh or lower abdomen daily. Rotate site. #30gms w/ 1 RF 30 each 0   nystatin-triamcinolone ointment (MYCOLOG) Apply 1 Application topically 2 (two) times daily. 30 g 0   telmisartan-hydrochlorothiazide (MICARDIS HCT) 40-12.5 MG tablet Take 1 tablet by mouth daily.     timolol (TIMOPTIC) 0.5 % ophthalmic solution 1 drop 2 (two) times daily.     valACYclovir (VALTREX) 500 MG tablet Take 1 tablet (500 mg total) by mouth 2 (two) times daily. Take twice a day for 3 days as needed for an outbreak. 30 tablet 1   venlafaxine (EFFEXOR) 37.5 MG tablet Take by mouth.     No current facility-administered  medications for this visit.    Family History  Problem Relation Age of Onset   Hypertension Mother    High Cholesterol Mother    Heart attack Father    Hypertension Father    High Cholesterol Father    Kidney disease Father    Stroke Father    Diabetes Father    Heart disease Father    High blood pressure Sister    High blood pressure Sister    Ovarian cancer Maternal Grandmother 57       ut and ov cancer dx at the same time in erh 38's,   Uterine cancer Maternal Grandmother 60   Pancreatic cancer Maternal Grandmother 70       died 1 month after   Heart attack Maternal Grandfather    Pancreatic cancer Paternal Grandmother    Heart attack Paternal Grandfather    Diabetes Daughter    Depression Daughter    Breast cancer Maternal Aunt 16   Colon cancer Maternal Uncle 45   Hypercalcemia Neg Hx     Review of  Systems  Exam:   There were no vitals taken for this visit.    General appearance: alert, cooperative and appears stated age Head: normocephalic, without obvious abnormality, atraumatic Neck: no adenopathy, supple, symmetrical, trachea midline and thyroid normal to inspection and palpation Lungs: clear to auscultation bilaterally Breasts: normal appearance, no masses or tenderness, No nipple retraction or dimpling, No nipple discharge or bleeding, No axillary adenopathy Heart: regular rate and rhythm Abdomen: soft, non-tender; no masses, no organomegaly Extremities: extremities normal, atraumatic, no cyanosis or edema Skin: skin color, texture, turgor normal. No rashes or lesions Lymph nodes: cervical, supraclavicular, and axillary nodes normal. Neurologic: grossly normal  Pelvic: External genitalia:  no lesions              No abnormal inguinal nodes palpated.              Urethra:  normal appearing urethra with no masses, tenderness or lesions              Bartholins and Skenes: normal                 Vagina: normal appearing vagina with normal color and discharge, no lesions              Cervix: no lesions              Pap taken: {yes no:314532} Bimanual Exam:  Uterus:  normal size, contour, position, consistency, mobility, non-tender              Adnexa: no mass, fullness, tenderness              Rectal exam: {yes no:314532}.  Confirms.              Anus:  normal sphincter tone, no lesions  Chaperone was present for exam:  ***  Assessment:   Well woman visit with gynecologic exam.   Plan: Mammogram screening discussed. Self breast awareness reviewed. Pap and HR HPV as above. Guidelines for Calcium, Vitamin D, regular exercise program including cardiovascular and weight bearing exercise.   Follow up annually and prn.   Additional counseling given.  {yes [id number]. _______ minutes face to face time of which over 50% was spent in counseling.    After visit summary  provided.

## 2023-07-09 NOTE — Telephone Encounter (Signed)
Call placed to patient x2, no answer, no voicemail.

## 2023-07-09 NOTE — Telephone Encounter (Signed)
MyChart response to patient.   Routing to Dr. Marjorie Smolder.   Encounter closed.

## 2023-07-23 ENCOUNTER — Ambulatory Visit: Payer: 59 | Admitting: Obstetrics and Gynecology

## 2023-09-08 ENCOUNTER — Other Ambulatory Visit: Payer: Self-pay | Admitting: Obstetrics and Gynecology

## 2023-09-24 ENCOUNTER — Other Ambulatory Visit: Payer: Self-pay | Admitting: Obstetrics and Gynecology

## 2023-09-24 NOTE — Telephone Encounter (Signed)
Med refill request: estradiol (vivelle-dot) 0.1 mg/24hr patch Last AEX: 05/15/22 Next AEX: 12/02/23 Last MMG (if hormonal med) Refill authorized: Last Rx was on 06/13/23 and it looks like this has been discontinued by provider. Please approve or deny as appropriate.

## 2023-10-08 NOTE — Telephone Encounter (Signed)
Call placed to patient, left detailed message, ok per dpr. Advised per Dr. Edward Jolly, return call to office at [phone], option 1 to schedule appt.   Rx refused. Needs appt,   Encounter closed.

## 2023-11-18 NOTE — Progress Notes (Deleted)
 48 y.o. G27P1001 Married [place] female here for annual exam.    PCP: Bernadette Hoit, MD   No LMP recorded. Patient is perimenopausal.           Sexually active: Yes.    The current method of family planning is none.    Menopausal hormone therapy:  estradiol patch and cream Exercising: {yes no:314532}  {types:19826} Smoker:  no  OB History  Gravida Para Term Preterm AB Living  1 1 1   1   SAB IAB Ectopic Multiple Live Births          # Outcome Date GA Lbr Len/2nd Weight Sex Type Anes PTL Lv  1 Term              HEALTH MAINTENANCE: Last 2 paps:  05/12/21 neg, 08/18/19 neg: HR HPV neg  History of abnormal Pap or positive HPV:  no Mammogram:   03/19/23 Breast Density Cat C, BI-RADS CAT 1 neg  Colonoscopy:  n/a Bone Density:  11/19/17  Result  ***   Immunization History  Administered Date(s) Administered   Influenza, Quadrivalent, Recombinant, Inj, Pf 08/22/2019   Influenza,inj,Quad PF,6-35 Mos 08/22/2019   Influenza-Unspecified 09/15/2018      reports that she has never smoked. She has never used smokeless tobacco. She reports that she does not drink alcohol and does not use drugs.  Past Medical History:  Diagnosis Date   COVID 02/2021   Elevated ALT measurement 2020   Essential hypertension    Family history of breast cancer    Family history of colon cancer    Family history of pancreatic cancer    Family history of uterine cancer    Glaucoma    HSV-1 infection    HSV-2 infection    Hyperlipidemia    Skin cancer     Past Surgical History:  Procedure Laterality Date   CESAREAN SECTION  1997   CHOLECYSTECTOMY     DG CHOLECYSTOGRAPHY GALL BLADDER ([facility] HX)  2017   HEMORRHOID SURGERY      Current Outpatient Medications  Medication Sig Dispense Refill   albuterol (VENTOLIN HFA) 108 (90 Base) MCG/ACT inhaler SMARTSIG:2 Puff(s) By Mouth Every 4-6 Hours PRN     amLODipine (NORVASC) 5 MG tablet Take 1 tablet by mouth daily.     cyclobenzaprine (FLEXERIL) 10  MG tablet Take 10 mg by mouth 3 (three) times daily as needed.     diclofenac (VOLTAREN) 75 MG EC tablet Take 75 mg by mouth 2 (two) times daily as needed.     estradiol (ESTRACE) 0.1 MG/GM vaginal cream Use 1/2 g vaginally every night for the first 2 weeks, then use 1/2 g vaginally two or three times per week as needed to maintain symptom relief. 42.5 g 1   lidocaine (XYLOCAINE) 5 % ointment Apply 1 Application topically 3 (three) times daily. Use three times daily as needed. 1.25 g 1   LINZESS 72 MCG capsule Take 72 mcg by mouth daily.     medroxyPROGESTERone (PROVERA) 10 MG tablet Take 1 tablet (10 mg total) by mouth daily. 10 tablet 0   metroNIDAZOLE (METROGEL) 0.75 % vaginal gel Place 1 Applicatorful vaginally at bedtime. Use for 5 nights. 70 g 0   NONFORMULARY OR COMPOUNDED ITEM Testosterone 1% Cream Apply 0.5gms to skin of thigh or lower abdomen daily. Rotate site. #30gms w/ 1 RF 30 each 0   nystatin-triamcinolone ointment (MYCOLOG) Apply 1 Application topically 2 (two) times daily. 30 g 0   telmisartan-hydrochlorothiazide (MICARDIS  HCT) 40-12.5 MG tablet Take 1 tablet by mouth daily.     timolol (TIMOPTIC) 0.5 % ophthalmic solution 1 drop 2 (two) times daily.     valACYclovir (VALTREX) 500 MG tablet Take 1 tablet (500 mg total) by mouth 2 (two) times daily. Take twice a day for 3 days as needed for an outbreak. 30 tablet 1   venlafaxine (EFFEXOR) 37.5 MG tablet Take by mouth.     No current facility-administered medications for this visit.    ALLERGIES: Dipyrone, Egg-derived products, Iodine, and Latex  Family History  Problem Relation Age of Onset   Hypertension Mother    High Cholesterol Mother    Heart attack Father    Hypertension Father    High Cholesterol Father    Kidney disease Father    Stroke Father    Diabetes Father    Heart disease Father    High blood pressure Sister    High blood pressure Sister    Ovarian cancer Maternal Grandmother 54       ut and ov cancer  dx at the same time in erh 60's,   Uterine cancer Maternal Grandmother 60   Pancreatic cancer Maternal Grandmother 25       died 1 month after   Heart attack Maternal Grandfather    Pancreatic cancer Paternal Grandmother    Heart attack Paternal Grandfather    Diabetes Daughter    Depression Daughter    Breast cancer Maternal Aunt 71   Colon cancer Maternal Uncle 45   Hypercalcemia Neg Hx     Review of Systems  PHYSICAL EXAM:  There were no vitals taken for this visit.    General appearance: alert, cooperative and appears stated age Head: normocephalic, without obvious abnormality, atraumatic Neck: no adenopathy, supple, symmetrical, trachea midline and thyroid normal to inspection and palpation Lungs: clear to auscultation bilaterally Breasts: normal appearance, no masses or tenderness, No nipple retraction or dimpling, No nipple discharge or bleeding, No axillary adenopathy Heart: regular rate and rhythm Abdomen: soft, non-tender; no masses, no organomegaly Extremities: extremities normal, atraumatic, no cyanosis or edema Skin: skin color, texture, turgor normal. No rashes or lesions Lymph nodes: cervical, supraclavicular, and axillary nodes normal. Neurologic: grossly normal  Pelvic: External genitalia:  no lesions              No abnormal inguinal nodes palpated.              Urethra:  normal appearing urethra with no masses, tenderness or lesions              Bartholins and Skenes: normal                 Vagina: normal appearing vagina with normal color and discharge, no lesions              Cervix: no lesions              Pap taken: {yes no:314532} Bimanual Exam:  Uterus:  normal size, contour, position, consistency, mobility, non-tender              Adnexa: no mass, fullness, tenderness              Rectal exam: {yes no:314532}.  Confirms.              Anus:  normal sphincter tone, no lesions  Chaperone was present for exam:  {BSCHAPERONE:31226::"Gauri Galvao F,  CMA"}  ASSESSMENT: Well woman visit with gynecologic exam  ***  PLAN:  Mammogram screening discussed. Self breast awareness reviewed. Pap and HRV collected:  {yes no:314532} Guidelines for Calcium, Vitamin D, regular exercise program including cardiovascular and weight bearing exercise. Medication refills:  *** {LABS (Optional):23779} Follow up:  ***    Additional counseling given.  {yes [id number]. ***  total time was spent for this patient encounter, including preparation, face-to-face counseling with the patient, coordination of care, and documentation of the encounter in addition to doing the well woman visit with gynecologic exam.

## 2023-12-02 ENCOUNTER — Ambulatory Visit: Payer: 59 | Admitting: Obstetrics and Gynecology

## 2024-03-04 DIAGNOSIS — F902 Attention-deficit hyperactivity disorder, combined type: Secondary | ICD-10-CM | POA: Insufficient documentation

## 2024-03-04 DIAGNOSIS — F3341 Major depressive disorder, recurrent, in partial remission: Secondary | ICD-10-CM | POA: Insufficient documentation

## 2024-03-05 DIAGNOSIS — E7800 Pure hypercholesterolemia, unspecified: Secondary | ICD-10-CM | POA: Insufficient documentation

## 2024-04-21 ENCOUNTER — Ambulatory Visit: Admitting: Dermatology

## 2024-04-21 ENCOUNTER — Encounter: Payer: Self-pay | Admitting: Dermatology

## 2024-04-21 VITALS — BP 99/63 | HR 93

## 2024-04-21 DIAGNOSIS — D1801 Hemangioma of skin and subcutaneous tissue: Secondary | ICD-10-CM

## 2024-04-21 DIAGNOSIS — L918 Other hypertrophic disorders of the skin: Secondary | ICD-10-CM

## 2024-04-21 DIAGNOSIS — Z1283 Encounter for screening for malignant neoplasm of skin: Secondary | ICD-10-CM

## 2024-04-21 DIAGNOSIS — L814 Other melanin hyperpigmentation: Secondary | ICD-10-CM

## 2024-04-21 DIAGNOSIS — Z85820 Personal history of malignant melanoma of skin: Secondary | ICD-10-CM

## 2024-04-21 DIAGNOSIS — L578 Other skin changes due to chronic exposure to nonionizing radiation: Secondary | ICD-10-CM

## 2024-04-21 DIAGNOSIS — W908XXA Exposure to other nonionizing radiation, initial encounter: Secondary | ICD-10-CM

## 2024-04-21 DIAGNOSIS — D485 Neoplasm of uncertain behavior of skin: Secondary | ICD-10-CM

## 2024-04-21 DIAGNOSIS — L821 Other seborrheic keratosis: Secondary | ICD-10-CM | POA: Diagnosis not present

## 2024-04-21 DIAGNOSIS — D229 Melanocytic nevi, unspecified: Secondary | ICD-10-CM

## 2024-04-21 MED ORDER — TRETINOIN 0.025 % EX CREA: TOPICAL_CREAM | Freq: Every day | CUTANEOUS | 1 refills | Status: DC

## 2024-04-21 NOTE — Progress Notes (Signed)
   New Patient Visit   Subjective  Ashley Shepard is a 48 y.o. female accompanied by husband Ashley Shepard) who presents for the following: Total Body Skin Exam (TBSE)  Patient present today for new patient visit for TBSE.The patient reports she has spots, moles and lesions to be evaluated, some may be new or changing and the patient may have concern these could be cancer. Patient has previously been treated by a dermatologist.Patient reports she has hx of bx (Melanoma in 2011). Patient denies family history of skin cancers. Patient reports throughout her lifetime has had severe sun exposure. Currently, patient reports if she has excessive sun exposure, she does apply sunscreen and/or wears protective coverings.  The following portions of the chart were reviewed this encounter and updated as appropriate: medications, allergies, medical history  Review of Systems:  No other skin or systemic complaints except as noted in HPI or Assessment and Plan.  Objective  Well appearing patient in no apparent distress; mood and affect are within normal limits.  A full examination was performed including scalp, head, eyes, ears, nose, lips, neck, chest, axillae, abdomen, back, buttocks, bilateral upper extremities, bilateral lower extremities, hands, feet, fingers, toes, fingernails, and toenails. All findings within normal limits unless otherwise noted below.   Relevant exam findings are noted in the Assessment and Plan.    Assessment & Plan   LENTIGINES, SEBORRHEIC KERATOSES, HEMANGIOMAS - Benign normal skin lesions - Benign-appearing - Call for any changes  MELANOCYTIC NEVI - Tan-brown and/or pink-flesh-colored symmetric macules and papules - Benign appearing on exam today - Observation - Call clinic for new or changing moles - Recommend daily use of broad spectrum spf 30+ sunscreen to sun-exposed areas.   ACTINIC DAMAGE - Chronic condition, secondary to cumulative UV/sun exposure -  diffuse scaly erythematous macules with underlying dyspigmentation - Recommend daily broad spectrum sunscreen SPF 30+ to sun-exposed areas, reapply every 2 hours as needed.  - Staying in the shade or wearing long sleeves, sun glasses (UVA+UVB protection) and wide brim hats (4-inch brim around the entire circumference of the hat) are also recommended for sun protection.  - Call for new or changing lesions.  HISTORY OF MELANOMA Upper Mid Chest - No evidence of recurrence today - No lymphadenopathy - Recommend regular full body skin exams - Recommend daily broad spectrum sunscreen SPF 30+ to sun-exposed areas, reapply every 2 hours as needed.  - Call if any new or changing lesions are noted between office visits  SKIN CANCER SCREENING PERFORMED TODAY  Dermatosis Papulosa Nigra  Skin Care: Dermatosis Papulosa Ferol can resolve with light electrodesiccation. Expectations: Dermatosis Papulosa Ferol are benign growths. No treatment is necessary. Plan: Cosmetic Quote The patient received the following cosmetic quote   Other Procedure(Skin Tags): Quote for cosmetic removal:  $200 to remove on 1 area  $300 to remove on 2 areas $400 to removal on 3 areas    Return in about 6 months (around 10/21/2024) for TBSE.  I, Jetta Ager, am acting as Neurosurgeon for [employer], DO.  Documentation: I have reviewed the above documentation for accuracy and completeness, and I agree with the above.  Delon Lenis, DO

## 2024-04-21 NOTE — Patient Instructions (Addendum)
 Patient Handout: Wound Care for Skin Biopsy Site  Taking Care of Your Skin Biopsy Site  Proper care of the biopsy site is essential for promoting healing and minimizing scarring. This handout provides instructions on how to care for your biopsy site to ensure optimal recovery.  1. Cleaning the Wound:  Clean the biopsy site daily with gentle soap and water. Gently pat the area dry with a clean, soft towel. Avoid harsh scrubbing or rubbing the area, as this can irritate the skin and delay healing.  2. Applying Aquaphor and Bandage:  After cleaning the wound, apply a thin layer of Aquaphor ointment to the biopsy site. Cover the area with a sterile bandage to protect it from dirt, bacteria, and friction. Change the bandage daily or as needed if it becomes soiled or wet.  3. Continued Care for One Week:  Repeat the cleaning, Aquaphor application, and bandaging process daily for one week following the biopsy procedure. Keeping the wound clean and moist during this initial healing period will help prevent infection and promote optimal healing.  4. Massaging Aquaphor into the Area:  ---After one week, discontinue the use of bandages but continue to apply Aquaphor to the biopsy site. ----Gently massage the Aquaphor into the area using circular motions. ---Massaging the skin helps to promote circulation and prevent the formation of scar tissue.   Additional Tips:  Avoid exposing the biopsy site to direct sunlight during the healing process, as this can cause hyperpigmentation or worsen scarring. If you experience any signs of infection, such as increased redness, swelling, warmth, or drainage from the wound, contact your healthcare provider immediately. Follow any additional instructions provided by your healthcare provider for caring for the biopsy site and managing any discomfort. Conclusion:  Taking proper care of your skin biopsy site is crucial for ensuring optimal healing and  minimizing scarring. By following these instructions for cleaning, applying Aquaphor, and massaging the area, you can promote a smooth and successful recovery. If you have any questions or concerns about caring for your biopsy site, don't hesitate to contact your healthcare provider for guidance.        Recommended Sunscreen: Isntree ([employer])      Important Information   Due to recent changes in healthcare laws, you may see results of your pathology and/or laboratory studies on MyChart before the doctors have had a chance to review them. We understand that in some cases there may be results that are confusing or concerning to you. Please understand that not all results are received at the same time and often the doctors may need to interpret multiple results in order to provide you with the best plan of care or course of treatment. Therefore, we ask that you please give us  2 business days to thoroughly review all your results before contacting the office for clarification. Should we see a critical lab result, you will be contacted sooner.     If You Need Anything After Your Visit   If you have any questions or concerns for your doctor, please call our main line at [phone]. If no one answers, please leave a voicemail as directed and we will return your call as soon as possible. Messages left after 4 pm will be answered the following business day.    You may also send us  a message via MyChart. We typically respond to MyChart messages within 1-2 business days.  For prescription refills, please ask your pharmacy to contact our office. Our fax number is [phone].  If you have an urgent issue when the clinic is closed that cannot wait until the next business day, you can page your doctor at the number below.     Please note that while we do our best to be available for urgent issues outside of office hours, we are not available 24/7.    If you have an urgent issue and are unable to  reach us , you may choose to seek medical care at your doctor's office, retail clinic, urgent care center, or emergency room.   If you have a medical emergency, please immediately call 911 or go to the emergency department. In the event of inclement weather, please call our main line at [phone] for an update on the status of any delays or closures.  Dermatology Medication Tips: Please keep the boxes that topical medications come in in order to help keep track of the instructions about where and how to use these. Pharmacies typically print the medication instructions only on the boxes and not directly on the medication tubes.   If your medication is too expensive, please contact our office at [phone] or send us  a message through MyChart.    We are unable to tell what your co-pay for medications will be in advance as this is different depending on your insurance coverage. However, we may be able to find a substitute medication at lower cost or fill out paperwork to get insurance to cover a needed medication.    If a prior authorization is required to get your medication covered by your insurance company, please allow us  1-2 business days to complete this process.   Drug prices often vary depending on where the prescription is filled and some pharmacies may offer cheaper prices.   The website www.goodrx.com contains coupons for medications through different pharmacies. The prices here do not account for what the cost may be with help from insurance (it may be cheaper with your insurance), but the website can give you the price if you did not use any insurance.  - You can print the associated coupon and take it with your prescription to the pharmacy.  - You may also stop by our office during regular business hours and pick up a GoodRx coupon card.  - If you need your prescription sent electronically to a different pharmacy, notify our office through [facility] or by phone at  [phone]

## 2024-04-22 LAB — SURGICAL PATHOLOGY

## 2024-04-23 ENCOUNTER — Ambulatory Visit: Payer: Self-pay | Admitting: Dermatology

## 2024-05-13 ENCOUNTER — Ambulatory Visit: Admitting: Dermatology

## 2024-05-13 ENCOUNTER — Encounter: Payer: Self-pay | Admitting: Dermatology

## 2024-05-13 VITALS — BP 96/63

## 2024-05-13 DIAGNOSIS — R239 Unspecified skin changes: Secondary | ICD-10-CM | POA: Diagnosis not present

## 2024-05-13 DIAGNOSIS — R238 Other skin changes: Secondary | ICD-10-CM

## 2024-05-13 DIAGNOSIS — L918 Other hypertrophic disorders of the skin: Secondary | ICD-10-CM

## 2024-05-13 DIAGNOSIS — L649 Androgenic alopecia, unspecified: Secondary | ICD-10-CM

## 2024-05-13 MED ORDER — SAFETY SEAL MISCELLANEOUS MISC: 5 refills | Status: DC

## 2024-05-13 MED ORDER — TRETINOIN 0.025 % EX CREA: TOPICAL_CREAM | Freq: Every day | CUTANEOUS | 1 refills | Status: DC

## 2024-05-13 NOTE — Progress Notes (Unsigned)
   Follow-Up Visit   Subjective  Ashley Shepard is a 48 y.o. female who presents for FOLLOW UP on the diagnoses listed below:  Patient was last evaluated on 04/21/24 for TBSE.   Hair Shedding: Patient is here today to discuss hair shedding that she believes is premenopausal. She has not tried any OTC shampoos or products.   Add on: She would like to discuss skin car routine and product recommendations. She was unable to obtain Tretinoin  from local pharmacy. She would like to try another pharmacy if possible.    The following portions of the chart were reviewed this encounter and updated as appropriate: medications, allergies, medical history  Review of Systems:  No other skin or systemic complaints except as noted in HPI or Assessment and Plan.  Objective  Well appearing patient in no apparent distress; mood and affect are within normal limits.   A focused examination was performed of the following areas: scalp   Relevant exam findings are noted in the Assessment and Plan.    Assessment & Plan   ANDROGENETIC ALOPECIA (FEMALE PATTERN HAIR LOSS) Exam: Flared, Diffuse thinning of the crown and widening of the midline part with retention of the frontal hairline  Treatment Plan: - Rx Medrock Minoxidil 8% Finasteride 0.1% solution - apply to the affected areas of the scalp every morning.  - Recommended Products: Viviscal or Nutrafol Supplements Vital Protein Collagen Powder  Long term medication management.  Patient is using long term (months to years) prescription medication  to control their dermatologic condition.  These medications require periodic monitoring to evaluate for efficacy and side effects and may require periodic laboratory monitoring.    ANDROGENETIC ALOPECIA    Return in about 4 months (around 09/13/2024) for androgenetic alopecia.   Documentation: I have reviewed the above documentation for accuracy and completeness, and I agree with the  above.  I, Shirron Maranda, CMA, am acting as scribe for [employer], DO.   Delon Lenis, DO

## 2024-05-13 NOTE — Patient Instructions (Addendum)
 Date: Wed May 13 2024  Hello Ashley Shepard,  Thank you for visiting today. Here is a summary of the key instructions:  - Prescription Solution (8% minoxidil and finasteride):   - Apply to scalp every morning   - Use 1-2 drops, rub in, avoid dripping   - Use a cotton ball or Q-tip if needed   - Continue using even if increased shedding occurs initially  - Tretinoin :   - Apply at night, 3 nights a week   - Wash face, apply moisturizer, use pea-sized amount of tretinoin , rub in, apply moisturizer again   - If stinging occurs, stop for a week and restart with 2 nights   - In winter, use 1-2 nights a week  - Supplements:   - Choose either Neutrophil (4 tablets daily, $80) or Viviscal (2 tablets daily, $40)   - Consider Vital Protein collagen powder  - Skin Care:   - Use sunscreen daily   - Continue prescribed skincare routine  - Follow-up: Return in 4 months to check progress of hair treatment and skincare routine  - Additional Instructions:   [facility] pharmacy will call to verify address and ship hair treatment   - [facility] pharmacy will call or text about tretinoin  prescription   - Limit hair highlights to once a year   - For amino acid hair treatments:     - Avoid using scalp drops the day before and two days after treatment  Please reach out if you have any questions or concerns.  Warm regards,  Dr. Delon Lenis Dermatology                  Important Information  Due to recent changes in healthcare laws, you may see results of your pathology and/or laboratory studies on MyChart before the doctors have had a chance to review them. We understand that in some cases there may be results that are confusing or concerning to you. Please understand that not all results are received at the same time and often the doctors may need to interpret multiple results in order to provide you with the best plan of care or course of treatment. Therefore, we ask that you please give us  2  business days to thoroughly review all your results before contacting the office for clarification. Should we see a critical lab result, you will be contacted sooner.   If You Need Anything After Your Visit  If you have any questions or concerns for your doctor, please call our main line at [phone] If no one answers, please leave a voicemail as directed and we will return your call as soon as possible. Messages left after 4 pm will be answered the following business day.   You may also send us  a message via MyChart. We typically respond to MyChart messages within 1-2 business days.  For prescription refills, please ask your pharmacy to contact our office. Our fax number is [phone].  If you have an urgent issue when the clinic is closed that cannot wait until the next business day, you can page your doctor at the number below.    Please note that while we do our best to be available for urgent issues outside of office hours, we are not available 24/7.   If you have an urgent issue and are unable to reach us , you may choose to seek medical care at your doctor's office, retail clinic, urgent care center, or emergency room.  If you have a medical emergency,  please immediately call 911 or go to the emergency department. In the event of inclement weather, please call our main line at [phone] for an update on the status of any delays or closures.  Dermatology Medication Tips: Please keep the boxes that topical medications come in in order to help keep track of the instructions about where and how to use these. Pharmacies typically print the medication instructions only on the boxes and not directly on the medication tubes.   If your medication is too expensive, please contact our office at [phone] or send us  a message through MyChart.   We are unable to tell what your co-pay for medications will be in advance as this is different depending on your insurance coverage. However, we  may be able to find a substitute medication at lower cost or fill out paperwork to get insurance to cover a needed medication.   If a prior authorization is required to get your medication covered by your insurance company, please allow us  1-2 business days to complete this process.  Drug prices often vary depending on where the prescription is filled and some pharmacies may offer cheaper prices.  The website www.goodrx.com contains coupons for medications through different pharmacies. The prices here do not account for what the cost may be with help from insurance (it may be cheaper with your insurance), but the website can give you the price if you did not use any insurance.  - You can print the associated coupon and take it with your prescription to the pharmacy.  - You may also stop by our office during regular business hours and pick up a GoodRx coupon card.  - If you need your prescription sent electronically to a different pharmacy, notify our office through [facility] or by phone at [phone]

## 2024-09-14 ENCOUNTER — Ambulatory Visit: Admitting: Dermatology

## 2024-09-14 ENCOUNTER — Encounter: Payer: Self-pay | Admitting: Dermatology

## 2024-09-14 VITALS — BP 127/79 | HR 80

## 2024-09-14 DIAGNOSIS — L649 Androgenic alopecia, unspecified: Secondary | ICD-10-CM

## 2024-09-14 DIAGNOSIS — Z79899 Other long term (current) drug therapy: Secondary | ICD-10-CM

## 2024-09-14 DIAGNOSIS — R238 Other skin changes: Secondary | ICD-10-CM

## 2024-09-14 MED ORDER — TRETINOIN 0.025 % EX CREA: TOPICAL_CREAM | Freq: Every day | CUTANEOUS | 3 refills | Status: AC

## 2024-09-14 MED ORDER — SAFETY SEAL MISCELLANEOUS MISC: 6 refills | Status: AC

## 2024-09-14 NOTE — Patient Instructions (Signed)

## 2024-09-14 NOTE — Progress Notes (Signed)
   Total Body Skin Exam (TBSE) Visit - Spot Check  Patient (and/or pt guardian) consented to the use of AI-assisted tools for note generation.    Subjective  Ashley Shepard is a 48 y.o. female who presents for the following:   ANDROGENETIC ALOPECIA Prescribed hormonic hair solution from [facility] - minoxidil (8%) and finasteride Patient is taking Viviscal and Vital Proteins Collagen Supplement  The following portions of the chart were reviewed this encounter and updated as appropriate: medications, allergies, medical history  Review of Systems:  No other skin or systemic complaints except as noted in HPI or Assessment and Plan.  Objective  Well appearing patient in no apparent distress; mood and affect are within normal limits.  A full examination was performed including scalp, head, eyes, ears, nose, lips, neck, chest, axillae, abdomen, back, buttocks, bilateral upper extremities, bilateral lower extremities, hands, feet, fingers, toes, fingernails, and toenails. All findings within normal limits unless otherwise noted below.   Relevant physical exam findings are noted in the Assessment and Plan.             Assessment & Plan    Androgenic alopecia Improving with current treatment. New hair growth is evident, particularly at the top of the scalp. Continued use of the prescription drops is necessary to maintain hair growth, as cessation for three months or more could lead to resumption of hair loss due to genetic factors. - Continue prescription drops once daily in the morning. - Refilled prescription for one year. - Advised that forgetting the drops for one or two weeks is not a problem, but stopping for three months or more could lead to hair loss. - Scheduled follow-up in one year.   Long term medication management.  Patient is using long term (months to years) prescription medication  to control their dermatologic condition.  These medications require periodic  monitoring to evaluate for efficacy and side effects and may require periodic laboratory monitoring.   Topical tretinoin  therapy for skin rejuvenation Topical tretinoin  therapy is effective for skin rejuvenation. Skin appears healthy with a nice glow. No irritation reported with nightly use. Potential for dryness and irritation in cold weather, which may require adjusting application frequency. - Continue tretinoin  application nightly. - Advised to take a break for a couple of days if skin becomes dry or irritated, then resume every other night if needed. - Sent new prescription with two refills.   ANDROGENETIC ALOPECIA   Related Medications Safety Seal Miscellaneous MISC Minoxidil 8% Finasteride 0.01% solution - apply to the affected areas of the scalp every morning. FACIAL AGING   Related Medications tretinoin  (RETIN-A ) 0.025 % cream Apply topically at bedtime. For the first month apply pea sized amount to your entire face 2 nights weekly, after 1 month increase to 3 nights weekly. If you experience excessive dryness cut back usage to 1-2 nights weekly Return in 1 year (on 09/14/2025) for Alopecia F/U.  LILLETTE Lyle Cords, am acting as a neurosurgeon for [employer], DO .   Documentation: I have reviewed the above documentation for accuracy and completeness, and I agree with the above.  Delon Lenis, DO

## 2025-09-16 ENCOUNTER — Ambulatory Visit: Admitting: Dermatology
